# Patient Record
Sex: Female | Born: 1995 | Race: White | Hispanic: No | Marital: Married
Health system: Southern US, Community
[De-identification: ages and names within clinical notes are randomized; demographics above are authoritative.]

## PROBLEM LIST (undated history)

## (undated) DIAGNOSIS — F419 Anxiety disorder, unspecified: Secondary | ICD-10-CM

## (undated) DIAGNOSIS — E785 Hyperlipidemia, unspecified: Secondary | ICD-10-CM

## (undated) HISTORY — DX: Anxiety disorder, unspecified: F41.9

## (undated) HISTORY — DX: Hyperlipidemia, unspecified: E78.5

---

## 2016-04-07 DIAGNOSIS — Z0000 Encounter for general adult medical examination without abnormal findings: Secondary | ICD-10-CM | POA: Diagnosis not present

## 2016-04-07 DIAGNOSIS — Z1159 Encounter for screening for other viral diseases: Secondary | ICD-10-CM | POA: Diagnosis not present

## 2016-04-25 DIAGNOSIS — Z23 Encounter for immunization: Secondary | ICD-10-CM | POA: Diagnosis not present

## 2016-05-11 DIAGNOSIS — Z6822 Body mass index (BMI) 22.0-22.9, adult: Secondary | ICD-10-CM | POA: Diagnosis not present

## 2016-05-11 DIAGNOSIS — I781 Nevus, non-neoplastic: Secondary | ICD-10-CM | POA: Diagnosis not present

## 2016-05-29 DIAGNOSIS — Z23 Encounter for immunization: Secondary | ICD-10-CM | POA: Diagnosis not present

## 2016-09-29 DIAGNOSIS — Z0184 Encounter for antibody response examination: Secondary | ICD-10-CM | POA: Diagnosis not present

## 2016-10-31 DIAGNOSIS — Z23 Encounter for immunization: Secondary | ICD-10-CM | POA: Diagnosis not present

## 2017-02-25 DIAGNOSIS — Z6821 Body mass index (BMI) 21.0-21.9, adult: Secondary | ICD-10-CM | POA: Diagnosis not present

## 2018-04-13 DIAGNOSIS — Z6824 Body mass index (BMI) 24.0-24.9, adult: Secondary | ICD-10-CM | POA: Diagnosis not present

## 2018-04-13 DIAGNOSIS — N926 Irregular menstruation, unspecified: Secondary | ICD-10-CM | POA: Diagnosis not present

## 2019-07-25 ENCOUNTER — Encounter: Payer: Self-pay | Admitting: Women's Health

## 2019-07-25 ENCOUNTER — Encounter: Payer: 59 | Admitting: Women's Health

## 2019-07-25 ENCOUNTER — Other Ambulatory Visit: Payer: Self-pay

## 2019-07-25 ENCOUNTER — Other Ambulatory Visit (HOSPITAL_COMMUNITY)
Admission: RE | Admit: 2019-07-25 | Discharge: 2019-07-25 | Disposition: A | Discharge status: Home / Self Care | Payer: 59 | Source: Ambulatory Visit | Attending: Obstetrics & Gynecology | Admitting: Obstetrics & Gynecology

## 2019-07-25 ENCOUNTER — Ambulatory Visit: Payer: 59 | Admitting: Women's Health

## 2019-07-25 VITALS — BP 135/82 | HR 112 | Ht 66.5 in | Wt 172.0 lb

## 2019-07-25 DIAGNOSIS — N6310 Unspecified lump in the right breast, unspecified quadrant: Secondary | ICD-10-CM

## 2019-07-25 DIAGNOSIS — Z01419 Encounter for gynecological examination (general) (routine) without abnormal findings: Secondary | ICD-10-CM

## 2019-07-25 NOTE — Progress Notes (Signed)
   WELL-WOMAN EXAMINATION Patient name: Emmalie Haigh MRN [id number]  Date of birth: [birth year] Chief Complaint:   No chief complaint on file.  History of Present Illness:   Becky Garcia is a 24 y.o. G0P0000 Caucasian female being seen today for a routine well-woman exam.  Current complaints: none  Depression screen PHQ 2/9 07/25/2019  Decreased Interest 0  Down, Depressed, Hopeless 0  PHQ - 2 Score 0  Altered sleeping 0  Tired, decreased energy 0  Change in appetite 0  Feeling bad or failure about yourself  0  Trouble concentrating 0  Moving slowly or fidgety/restless 0  Suicidal thoughts 0  PHQ-9 Score 0  Difficult doing work/chores Not difficult at all     PCP: [facility]      does not desire labs Patient's last menstrual period was 07/04/2019 (exact date). The current method of family planning is OCP (estrogen/progesterone).  Last pap never. Results were: N/A. H/O abnormal pap: no Last mammogram: never. Results were: N/A. Family h/o breast cancer: yes possibly [place] Last colonoscopy: never. Results were: N/A. Family h/o colorectal cancer: yes PGGM Review of Systems:   Pertinent items are noted in HPI Denies any headaches, blurred vision, fatigue, shortness of breath, chest pain, abdominal pain, abnormal vaginal discharge/itching/odor/irritation, problems with periods, bowel movements, urination, or intercourse unless otherwise stated above. Pertinent History Reviewed:  Reviewed past medical,surgical, social and family history.  Reviewed problem list, medications and allergies. Physical Assessment:   Vitals:   07/25/19 1511  BP: 135/82  Pulse: (!) 112  Weight: 172 lb (78 kg)  Height: 5' 6.5" (1.689 m)  Body mass index is 27.35 kg/m.        Physical Examination:   General appearance - well appearing, and in no distress  Mental status - alert, oriented to person, place, and time  Psych:  She has a normal mood and affect  Skin - warm and dry, normal color, no  suspicious lesions noted  Chest - effort normal, all lung fields clear to auscultation bilaterally  Heart - normal rate and regular rhythm  Neck:  midline trachea, no thyromegaly or nodules  Breasts - Lt breast appear normal, no suspicious masses, no skin or nipple changes or axillary nodes, Rt breast ~0.5cm firm round mass 1 o'clock w/in areola  Abdomen - soft, nontender, nondistended, no masses or organomegaly  Pelvic - VULVA: normal appearing vulva with no masses, tenderness or lesions  VAGINA: normal appearing vagina with normal color and discharge, no lesions  CERVIX: normal appearing cervix without discharge or lesions, no CMT  Thin prep pap is done w/ HR HPV cotesting  UTERUS: uterus is felt to be normal size, shape, consistency and nontender   ADNEXA: No adnexal masses or tenderness noted.  Extremities:  No swelling or varicosities noted  Chaperone: Peggy Dones    No results found for this or any previous visit (from the past 24 hour(s)).  Assessment & Plan:  1) Well-Woman Exam  2) Rt breast mass> breast u/s 7/6 @ 1140 @ [facility], be there @ 1120  Labs/procedures today: pap  Mammogram @25yo  or sooner if problems Colonoscopy @25yo  or sooner if problems  Orders Placed This Encounter  Procedures  . US BREAST LTD UNI RIGHT INC AXILLA    Meds: No orders of the defined types were placed in this encounter.   Follow-up: No follow-ups on file.  [place], [facility] 07/25/2019 3:13 PM

## 2019-07-25 NOTE — Patient Instructions (Signed)
Breast ultrasound 7/6 @ 11:40am at [facility], be there at 11:20am, no lotion/deoderant/powder/perfume that day

## 2019-07-26 NOTE — Progress Notes (Signed)
This encounter was created in error - please disregard. Visit type was changed from gyn visit to pap & physical.

## 2019-07-28 LAB — CYTOLOGY - PAP
Chlamydia: NEGATIVE
Comment: NEGATIVE
Comment: NEGATIVE
Comment: NORMAL
Diagnosis: NEGATIVE
High risk HPV: NEGATIVE
Neisseria Gonorrhea: NEGATIVE

## 2019-08-08 ENCOUNTER — Ambulatory Visit (HOSPITAL_COMMUNITY)
Admission: RE | Admit: 2019-08-08 | Discharge: 2019-08-08 | Disposition: A | Discharge status: Home / Self Care | Payer: 59 | Source: Ambulatory Visit | Attending: Women's Health | Admitting: Women's Health

## 2019-08-08 ENCOUNTER — Other Ambulatory Visit: Payer: Self-pay

## 2019-08-08 DIAGNOSIS — N6310 Unspecified lump in the right breast, unspecified quadrant: Secondary | ICD-10-CM | POA: Diagnosis present

## 2020-07-27 IMAGING — US US BREAST*R* LIMITED INC AXILLA
1 series · 4 of 4 positions shown · non-contrast
Comparison: None.

CLINICAL DATA: 24-year-old female with a palpable area of concern
in the right breast which she has been feeling for the past 2 years.
She states this has not changed in size.

EXAM:
ULTRASOUND OF THE RIGHT BREAST

[Series 1: us breast*right* limited inc axilla · 0.04mm/px · 4 of 4 slices shown]
[im 1/4]
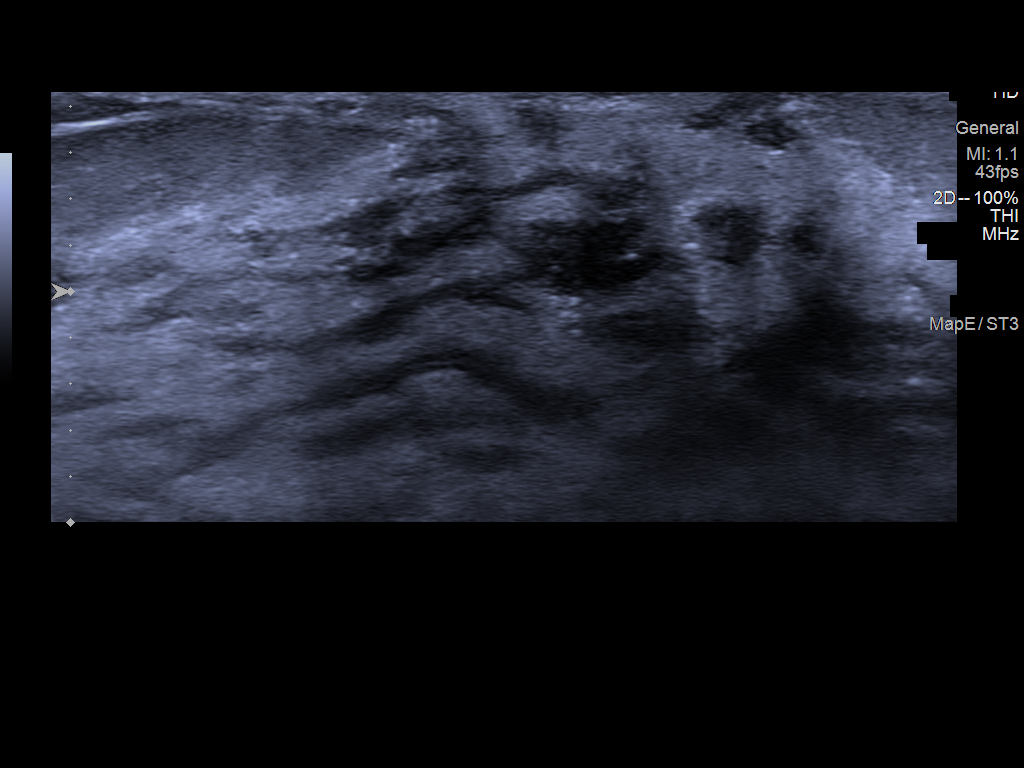
[im 2/4]
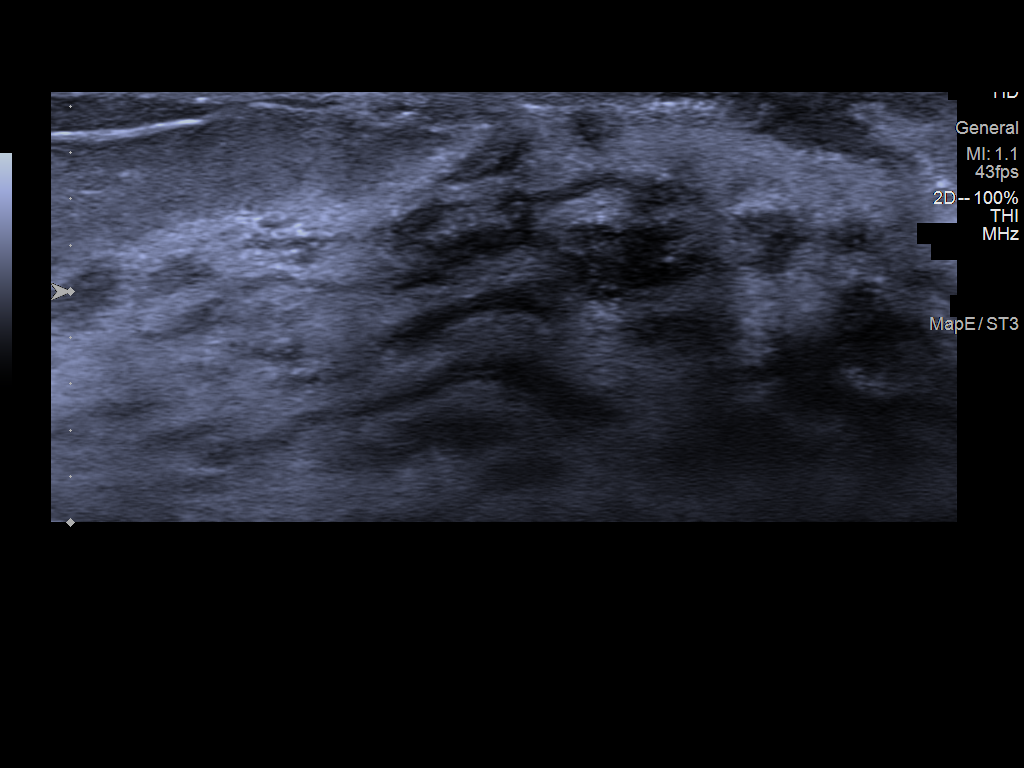
[im 3/4]
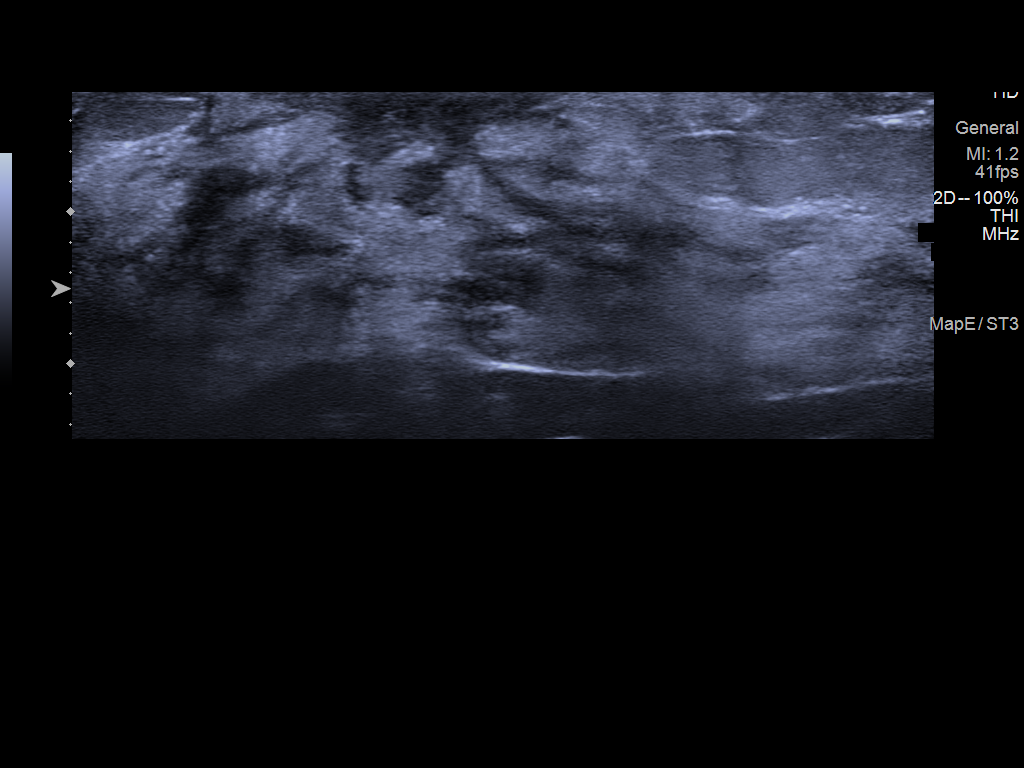
[im 4/4]
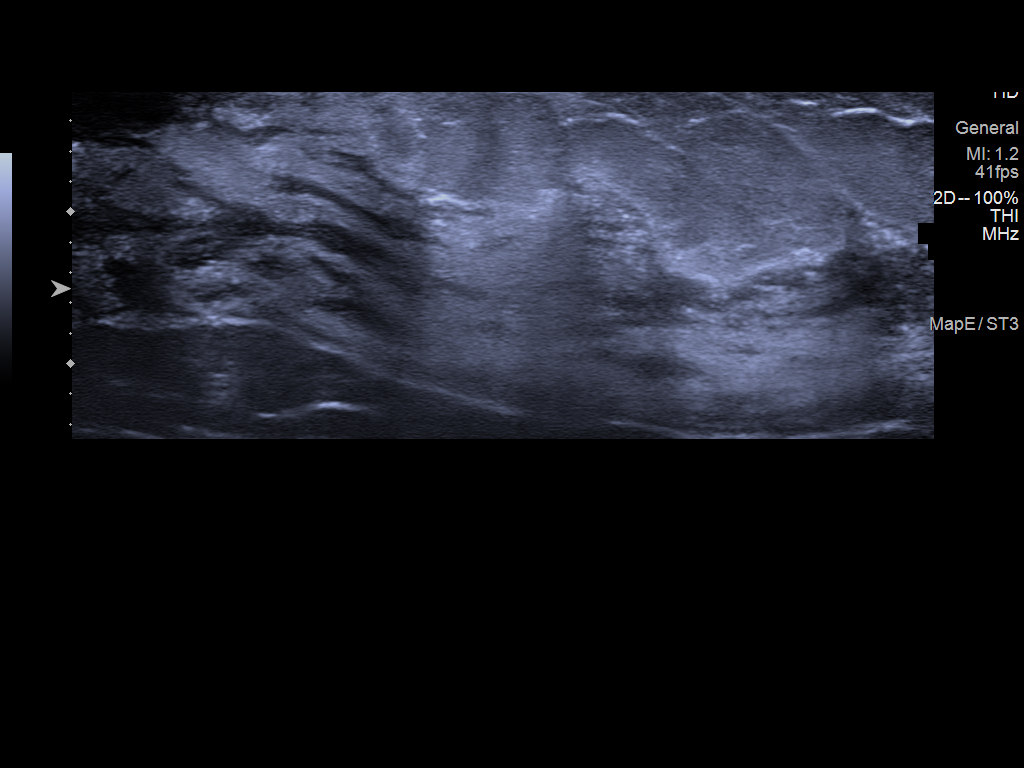

[4 of 4 positions shown; findings below may reference images not displayed]

FINDINGS: Physical examination reveals a pea-sized mobile area of thickening
at the 12 o'clock periareolar location.

Targeted ultrasound of the right breast was performed. No suspicious
masses or abnormality seen, only dense fibroglandular tissue
identified. This palpable area of concern may be related to dense
fibroglandular tissue in this location.
IMPRESSION: No suspicious sonographic abnormalities at the site of palpable
concern in the right breast. This palpable area of concern may be
related to dense fibroglandular tissue in this location.

RECOMMENDATION:
1. Recommend further management of the right breast palpable area of
concern be based on clinical assessment.

2. Screening mammogram at age 40 unless there are persistent or
intervening clinical concerns. (Code:IW-[id number])

I have discussed the findings and recommendations with the patient.
If applicable, a reminder letter will be sent to the patient
regarding the next appointment.

BI-RADS CATEGORY  1: Negative.

## 2021-06-17 ENCOUNTER — Ambulatory Visit: Payer: BC Managed Care – PPO | Admitting: Women's Health

## 2021-07-22 ENCOUNTER — Encounter: Payer: Self-pay | Admitting: Women's Health

## 2021-07-22 ENCOUNTER — Ambulatory Visit (INDEPENDENT_AMBULATORY_CARE_PROVIDER_SITE_OTHER): Payer: BC Managed Care – PPO | Admitting: Women's Health

## 2021-07-22 VITALS — BP 134/76 | HR 100

## 2021-07-22 DIAGNOSIS — F419 Anxiety disorder, unspecified: Secondary | ICD-10-CM | POA: Insufficient documentation

## 2021-07-22 DIAGNOSIS — Z3169 Encounter for other general counseling and advice on procreation: Secondary | ICD-10-CM

## 2021-07-22 MED ORDER — ESCITALOPRAM OXALATE 10 MG PO TABS: 10.0000 mg | ORAL_TABLET | Freq: Every day | ORAL | 3 refills | Status: DC

## 2021-07-22 NOTE — Progress Notes (Signed)
Patient wants to get pregnant within the next few months and wants to know if Prozac is ok to take.   LMP 07/01/21

## 2021-07-22 NOTE — Progress Notes (Signed)
GYN VISIT Patient name: Becky Garcia MRN [id number]  Date of birth: 03-28-1995 Chief Complaint:   Gynecologic Exam  History of Present Illness:   Becky Garcia is a 26 y.o. G46 Caucasian female being seen today to discuss getting pregnant. Is going to [place] in Sept- doesn't want to be pregnant or on period when she goes. Wants to start trying when they get back. Already taking pnv. Is on prozac, wanted to know if ok to continue. Takes for anxiety, no depression. Has never tried anything else. Her mom is on lexapro and does well w/ it.   No LMP recorded. (Menstrual status: Oral contraceptives). The current method of family planning is OCP (estrogen/progesterone).  Last pap 07/25/19. Results were: NILM w/ HRHPV negative     07/25/2019    2:30 PM  Depression screen PHQ 2/9  Decreased Interest 0  Down, Depressed, Hopeless 0  PHQ - 2 Score 0  Altered sleeping 0  Tired, decreased energy 0  Change in appetite 0  Feeling bad or failure about yourself  0  Trouble concentrating 0  Moving slowly or fidgety/restless 0  Suicidal thoughts 0  PHQ-9 Score 0  Difficult doing work/chores Not difficult at all        07/25/2019    2:30 PM  GAD 7 : Generalized Anxiety Score  Nervous, Anxious, on Edge 0  Control/stop worrying 0  Worry too much - different things 0  Trouble relaxing 0  Restless 0  Easily annoyed or irritable 0  Afraid - awful might happen 0  Total GAD 7 Score 0  Anxiety Difficulty Not difficult at all     Review of Systems:   Pertinent items are noted in HPI Denies fever/chills, dizziness, headaches, visual disturbances, fatigue, shortness of breath, chest pain, abdominal pain, vomiting, abnormal vaginal discharge/itching/odor/irritation, problems with periods, bowel movements, urination, or intercourse unless otherwise stated above.  Pertinent History Reviewed:  Reviewed past medical,surgical, social, obstetrical and family history.  Reviewed problem list, medications and  allergies. Physical Assessment:   Vitals:   07/22/21 0934  BP: 134/76  Pulse: 100  There is no height or weight on file to calculate BMI.       Physical Examination:   General appearance: alert, well appearing, and in no distress  Mental status: alert, oriented to person, place, and time  Skin: warm & dry   Cardiovascular: normal heart rate noted  Respiratory: normal respiratory effort, no distress  Abdomen: soft, non-tender   Pelvic: examination not indicated  Extremities: no edema   Chaperone: N/A    No results found for this or any previous visit (from the past 24 hour(s)).  Assessment & Plan:  1) Preconception counseling> can take COCs continuously to skip a period while in [place]. Stop COCs when gets back. Continue pnv.   2) Anxiety> Discussed prozac has been associated w/ fetal cardiac malformations, so best to switch before pregnancy. Pt ok w/ trialing lexapro '10mg'$  daily. F/U 4wks (can be online).   Meds:  Meds ordered this encounter  Medications   escitalopram (LEXAPRO) 10 MG tablet    Sig: Take 1 tablet (10 mg total) by mouth daily.    Dispense:  90 tablet    Refill:  3    Order Specific Question:   Supervising Provider    Answer:   Elonda Husky, LUTHER H [[id number]]    No orders of the defined types were placed in this encounter.   Return in about 4 weeks (around 08/19/2021) for med  f/u, MyChart Video.  [place], [facility] 07/22/2021 10:06 AM

## 2021-08-19 ENCOUNTER — Telehealth: Payer: BC Managed Care – PPO | Admitting: Women's Health

## 2021-08-25 ENCOUNTER — Encounter: Payer: Self-pay | Admitting: Women's Health

## 2021-08-25 ENCOUNTER — Telehealth (INDEPENDENT_AMBULATORY_CARE_PROVIDER_SITE_OTHER): Payer: BC Managed Care – PPO | Admitting: Women's Health

## 2021-08-25 DIAGNOSIS — F419 Anxiety disorder, unspecified: Secondary | ICD-10-CM

## 2021-08-25 NOTE — Progress Notes (Signed)
TELEHEALTH VIRTUAL GYN VISIT ENCOUNTER NOTE Patient name: Becky Garcia MRN [id number]  Date of birth: 1995-08-03  I connected with patient on 08/25/21 at  9:30 AM EDT by MyChart video  and verified that I am speaking with the correct person using two identifiers.  Pt is not currently in the office, she is at work.  Provider is in the office.    I discussed the limitations, risks, security and privacy concerns of performing an evaluation and management service by telephone and the availability of in person appointments. I also discussed with the patient that there may be a patient responsible charge related to this service. The patient expressed understanding and agreed to proceed.   Chief Complaint:   Medication f/u  History of Present Illness:   Becky Garcia is a 26 y.o. G33 Caucasian female being evaluated today for f/u on lexapro '10mg'$  rx'd 07/22/21. She had previously been on prozac and is planning pregnancy soon, so stopped prozac d/t potential fetal cardiac malformations. States she is doing well on lexapro, feels the same as when she was on prozac, feels '10mg'$  is an appropriate dose. PHQ9 1 & GAD7 1. Goes to [place] in Sept, and plans to stop COCs when she returns to try to conceive. Is already taking pnv.     08/25/2021    9:57 AM 07/25/2019    2:30 PM  Depression screen PHQ 2/9  Decreased Interest 0 0  Down, Depressed, Hopeless 0 0  PHQ - 2 Score 0 0  Altered sleeping 0 0  Tired, decreased energy 1 0  Change in appetite 0 0  Feeling bad or failure about yourself  0 0  Trouble concentrating 0 0  Moving slowly or fidgety/restless 0 0  Suicidal thoughts 0 0  PHQ-9 Score 1 0  Difficult doing work/chores  Not difficult at all    Patient's last menstrual period was 08/25/2021. The current method of family planning is OCP (estrogen/progesterone).  Last pap 07/25/19. Results were:  NILM w/ HRHPV negative Review of Systems:   Pertinent items are noted in HPI Denies fever/chills,  dizziness, headaches, visual disturbances, fatigue, shortness of breath, chest pain, abdominal pain, vomiting, abnormal vaginal discharge/itching/odor/irritation, problems with periods, bowel movements, urination, or intercourse unless otherwise stated above.  Pertinent History Reviewed:  Reviewed past medical,surgical, social, obstetrical and family history.  Reviewed problem list, medications and allergies. Physical Assessment:  There were no vitals filed for this visit.There is no height or weight on file to calculate BMI.       Physical Examination:   General:  Alert, oriented and cooperative.   Mental Status: Normal mood and affect perceived. Normal judgment and thought content.  Physical exam deferred due to nature of the encounter  No results found for this or any previous visit (from the past 24 hour(s)).  Assessment & Plan:  1) Anxiety> doing well on lexapro '10mg'$  after switching from prozac (d/t trying to conceive soon, and potential risk for fetal cardiac malformation).   2) Plans pregnancy soon> plans to stop COCs when returns from [place]. Continue pnv. Let us know when gets +HPT.    Meds: No orders of the defined types were placed in this encounter.   No orders of the defined types were placed in this encounter.   I discussed the assessment and treatment plan with the patient. The patient was provided an opportunity to ask questions and all were answered. The patient agreed with the plan and demonstrated an understanding of the instructions.  The patient was advised to call back or seek an in-person evaluation/go to the ED if the symptoms worsen or if the condition fails to improve as anticipated.  I provided 5 minutes of non-face-to-face time during this encounter.   Return for prn.  [place], [facility] 08/25/2021 10:11 AM

## 2021-08-29 ENCOUNTER — Encounter: Payer: Self-pay | Admitting: Women's Health

## 2022-01-12 ENCOUNTER — Encounter: Payer: Self-pay | Admitting: Women's Health

## 2022-01-15 ENCOUNTER — Encounter: Payer: Self-pay | Admitting: Women's Health

## 2022-01-20 ENCOUNTER — Other Ambulatory Visit: Payer: Self-pay | Admitting: Obstetrics & Gynecology

## 2022-01-20 DIAGNOSIS — O3680X0 Pregnancy with inconclusive fetal viability, not applicable or unspecified: Secondary | ICD-10-CM

## 2022-01-21 ENCOUNTER — Encounter: Payer: Self-pay | Admitting: *Deleted

## 2022-01-21 ENCOUNTER — Ambulatory Visit (INDEPENDENT_AMBULATORY_CARE_PROVIDER_SITE_OTHER): Payer: BC Managed Care – PPO | Admitting: *Deleted

## 2022-01-21 ENCOUNTER — Ambulatory Visit (INDEPENDENT_AMBULATORY_CARE_PROVIDER_SITE_OTHER): Payer: BC Managed Care – PPO

## 2022-01-21 VITALS — BP 125/77 | HR 83 | Wt 176.0 lb

## 2022-01-21 DIAGNOSIS — Z3401 Encounter for supervision of normal first pregnancy, first trimester: Secondary | ICD-10-CM

## 2022-01-21 DIAGNOSIS — O3680X0 Pregnancy with inconclusive fetal viability, not applicable or unspecified: Secondary | ICD-10-CM

## 2022-01-21 DIAGNOSIS — Z3A01 Less than 8 weeks gestation of pregnancy: Secondary | ICD-10-CM | POA: Diagnosis not present

## 2022-01-21 DIAGNOSIS — Z3400 Encounter for supervision of normal first pregnancy, unspecified trimester: Secondary | ICD-10-CM | POA: Insufficient documentation

## 2022-01-21 NOTE — Progress Notes (Signed)
   INITIAL OB NURSE INTAKE  SUBJECTIVE:  Becky Garcia is a 26 y.o. No obstetric history on file. female Unknown by LMP c/w today's U/S with an Estimated Date of Delivery: None noted. being seen today for her initial OB intake/educational visit with RN. She is taking prenatal vitamins.  She is not having nausea and/or vomiting and does not request nausea meds at this time.  Patient's last menstrual period was 11/27/2021.  Patient's medical, surgical, and obstetrical history obtained and reviewed.  Current medications reviewed.   Patient Active Problem List   Diagnosis Date Noted   Supervision of normal first pregnancy 01/21/2022   Anxiety 07/22/2021   Past Medical History:  Diagnosis Date   Anxiety    Hyperlipemia    History reviewed. No pertinent surgical history. OB History     Gravida  1   Para      Term      Preterm      AB      Living         SAB      IAB      Ectopic      Multiple      Live Births              OBJECTIVE:  BP 125/77   Pulse 83   Wt 176 lb (79.8 kg)   LMP 11/27/2021   BMI 27.98 kg/m   ASSESSMENT/PLAN: No obstetric history on file. at Unknown with an Estimated Date of Delivery: None noted.  Prenatal vitamins: continue   Nausea medicines: not currently needed   OB packet given: Yes BabyScripts and MyChart activated  Blood Pressure Cuff: has at home. Discussed to be used for virtual visits and home BP checks.  Genetic & carrier screening discussed: requests NT/IT, undecided about Panorama and Horizon  Placed OB Box on problem list and updated Reviewed recommended weight gain based on pre-gravid BMI BMI 25-29.9, gain max 15-25lb  Follow-up in 5 weeks for NT U/S & New OB visit with provider  Face-to-face time at least 30 minutes. 50% or more of this visit was spent in counseling and coordination of care.  Kristeen Miss Sanna Porcaro RN-C 01/21/2022 4:34 PM

## 2022-01-21 NOTE — Progress Notes (Signed)
[place] 7+6 wks,single IUP with YS,FHR 150 bpm,normal right ovary,simple left corpus luteal cyst 2.5 x 2.3 x 2.2 cm,CRL 11.97 mm

## 2022-01-28 ENCOUNTER — Encounter: Payer: Self-pay | Admitting: Women's Health

## 2022-02-02 NOTE — L&D Delivery Note (Signed)
OB/GYN Faculty Practice Delivery Note  Becky Garcia is a 27 y.o. G1P1001 s/p VD at [redacted]w[redacted]d. She was admitted for SOL.   ROM: 0h 24m with  fluid GBS Status:  Negative/-- (07/11 1215) Maximum Maternal Temperature: 98.69F  Labor Progress: Initial SVE: 7/90/-2. She then progressed to complete.   Delivery Date/Time: 08/28/22 1152 Delivery: Called to room and patient was complete and pushing. Head delivered LOA. Nuchal cord present x1, loose and reduced. Shoulder and body delivered in usual fashion. Infant with spontaneous cry, placed on mother's abdomen, dried and stimulated. Cord clamped x 2 after 1-minute delay, and cut by FOB. Cord blood drawn. Placenta delivered spontaneously with gentle cord traction. Fundus firm with massage and Pitocin. Labia, perineum, vagina, and cervix inspected with 2nd degree laceration, repaired in usual fashion.  Baby Weight: pending  Placenta: 3 vessel, intact. Sent to L&D Complications: None Lacerations: as above EBL: 127 mL Analgesia: Epidural   Infant:  APGAR (1 MIN): 9  APGAR (5 MINS): 9   Myrtie Hawk, DO OB Family Medicine Fellow, [facility] for [employer], [facility] 08/28/2022, 12:59 PM

## 2022-02-27 ENCOUNTER — Other Ambulatory Visit: Payer: Self-pay | Admitting: Obstetrics & Gynecology

## 2022-02-27 DIAGNOSIS — Z3682 Encounter for antenatal screening for nuchal translucency: Secondary | ICD-10-CM

## 2022-03-02 ENCOUNTER — Ambulatory Visit (INDEPENDENT_AMBULATORY_CARE_PROVIDER_SITE_OTHER): Payer: BC Managed Care – PPO

## 2022-03-02 ENCOUNTER — Encounter: Payer: Self-pay | Admitting: Women's Health

## 2022-03-02 ENCOUNTER — Ambulatory Visit (INDEPENDENT_AMBULATORY_CARE_PROVIDER_SITE_OTHER): Payer: BC Managed Care – PPO | Admitting: Women's Health

## 2022-03-02 VITALS — BP 114/73 | HR 88 | Wt 171.0 lb

## 2022-03-02 DIAGNOSIS — D219 Benign neoplasm of connective and other soft tissue, unspecified: Secondary | ICD-10-CM

## 2022-03-02 DIAGNOSIS — Z3A13 13 weeks gestation of pregnancy: Secondary | ICD-10-CM | POA: Diagnosis not present

## 2022-03-02 DIAGNOSIS — Z3682 Encounter for antenatal screening for nuchal translucency: Secondary | ICD-10-CM | POA: Diagnosis not present

## 2022-03-02 DIAGNOSIS — Z3401 Encounter for supervision of normal first pregnancy, first trimester: Secondary | ICD-10-CM | POA: Diagnosis not present

## 2022-03-02 DIAGNOSIS — Z131 Encounter for screening for diabetes mellitus: Secondary | ICD-10-CM

## 2022-03-02 NOTE — Patient Instructions (Signed)
Becky Garcia, thank you for choosing our office today! We appreciate the opportunity to meet your healthcare needs. You may receive a short survey by mail, e-mail, or through [employer]. If you are happy with your care we would appreciate if you could take just a few minutes to complete the survey questions. We read all of your comments and take your feedback very seriously. Thank you again for choosing our office.  [facility] at [place] at [facility] ([place], [place] [address]) Entrance C, located off of [address] parking   Nausea & Vomiting Have saltine crackers or pretzels by your bed and eat a few bites before you raise your head out of bed in the morning Eat small frequent meals throughout the day instead of large meals Drink plenty of fluids throughout the day to stay hydrated, just don't drink a lot of fluids with your meals.  This can make your stomach fill up faster making you feel sick Do not brush your teeth right after you eat Products with real ginger are good for nausea, like ginger ale and ginger hard candy Make sure it says made with real ginger! Sucking on sour candy like lemon heads is also good for nausea If your prenatal vitamins make you nauseated, take them at night so you will sleep through the nausea Sea Bands If you feel like you need medicine for the nausea & vomiting please let us know If you are unable to keep any fluids or food down please let us know   Constipation Drink plenty of fluid, preferably water, throughout the day Eat foods high in fiber such as fruits, vegetables, and grains Exercise, such as walking, is a good way to keep your bowels regular Drink warm fluids, especially warm prune juice, or decaf coffee Eat a 1/2 cup of real oatmeal (not instant), 1/2 cup applesauce, and 1/2-1 cup warm prune juice every day If needed, you may take Colace (docusate sodium) stool softener  once or twice a day to help keep the stool soft.  If you still are having problems with constipation, you may take Miralax once daily as needed to help keep your bowels regular.   Home Blood Pressure Monitoring for Patients   Your provider has recommended that you check your blood pressure (BP) at least once a week at home. If you do not have a blood pressure cuff at home, one will be provided for you. Contact your provider if you have not received your monitor within 1 week.   Helpful Tips for Accurate Home Blood Pressure Checks  Don't smoke, exercise, or drink caffeine 30 minutes before checking your BP Use the restroom before checking your BP (a full bladder can raise your pressure) Relax in a comfortable upright chair Feet on the ground Left arm resting comfortably on a flat surface at the level of your heart Legs uncrossed Back supported Sit quietly and don't talk Place the cuff on your bare arm Adjust snuggly, so that only two fingertips can fit between your skin and the top of the cuff Check 2 readings separated by at least one minute Keep a log of your BP readings For a visual, please reference this diagram: http://ccnc.care/bpdiagram  Provider Name: [facility]     Phone: [phone]  Zone 1: ALL CLEAR  Continue to monitor your symptoms:  BP reading is less than 140 (top number) or less than 90 (bottom  number)  No right upper stomach pain No headaches or seeing spots No feeling nauseated or throwing up No swelling in face and hands  Zone 2: CAUTION Call your doctor's office for any of the following:  BP reading is greater than 140 (top number) or greater than 90 (bottom number)  Stomach pain under your ribs in the middle or right side Headaches or seeing spots Feeling nauseated or throwing up Swelling in face and hands  Zone 3: EMERGENCY  Seek immediate medical care if you have any of the following:  BP reading is greater than160 (top number) or greater than  110 (bottom number) Severe headaches not improving with Tylenol Serious difficulty catching your breath Any worsening symptoms from Zone 2    First Trimester of Pregnancy The first trimester of pregnancy is from week 1 until the end of week 12 (months 1 through 3). A week after a sperm fertilizes an egg, the egg will implant on the wall of the uterus. This embryo will begin to develop into a baby. Genes from you and your partner are forming the baby. The female genes determine whether the baby is a boy or a girl. At 6-8 weeks, the eyes and face are formed, and the heartbeat can be seen on ultrasound. At the end of 12 weeks, all the baby's organs are formed.  Now that you are pregnant, you will want to do everything you can to have a healthy baby. Two of the most important things are to get good prenatal care and to follow your health care provider's instructions. Prenatal care is all the medical care you receive before the baby's birth. This care will help prevent, find, and treat any problems during the pregnancy and childbirth. BODY CHANGES Your body goes through many changes during pregnancy. The changes vary from woman to woman.  You may gain or lose a couple of pounds at first. You may feel sick to your stomach (nauseous) and throw up (vomit). If the vomiting is uncontrollable, call your health care provider. You may tire easily. You may develop headaches that can be relieved by medicines approved by your health care provider. You may urinate more often. Painful urination may mean you have a bladder infection. You may develop heartburn as a result of your pregnancy. You may develop constipation because certain hormones are causing the muscles that push waste through your intestines to slow down. You may develop hemorrhoids or swollen, bulging veins (varicose veins). Your breasts may begin to grow larger and become tender. Your nipples may stick out more, and the tissue that surrounds them  (areola) may become darker. Your gums may bleed and may be sensitive to brushing and flossing. Dark spots or blotches (chloasma, mask of pregnancy) may develop on your face. This will likely fade after the baby is born. Your menstrual periods will stop. You may have a loss of appetite. You may develop cravings for certain kinds of food. You may have changes in your emotions from day to day, such as being excited to be pregnant or being concerned that something may go wrong with the pregnancy and baby. You may have more vivid and strange dreams. You may have changes in your hair. These can include thickening of your hair, rapid growth, and changes in texture. Some women also have hair loss during or after pregnancy, or hair that feels dry or thin. Your hair will most likely return to normal after your baby is born. WHAT TO EXPECT AT YOUR PRENATAL  VISITS During a routine prenatal visit: You will be weighed to make sure you and the baby are growing normally. Your blood pressure will be taken. Your abdomen will be measured to track your baby's growth. The fetal heartbeat will be listened to starting around week 10 or 12 of your pregnancy. Test results from any previous visits will be discussed. Your health care provider may ask you: How you are feeling. If you are feeling the baby move. If you have had any abnormal symptoms, such as leaking fluid, bleeding, severe headaches, or abdominal cramping. If you have any questions. Other tests that may be performed during your first trimester include: Blood tests to find your blood type and to check for the presence of any previous infections. They will also be used to check for low iron levels (anemia) and Rh antibodies. Later in the pregnancy, blood tests for diabetes will be done along with other tests if problems develop. Urine tests to check for infections, diabetes, or protein in the urine. An ultrasound to confirm the proper growth and development  of the baby. An amniocentesis to check for possible genetic problems. Fetal screens for spina bifida and Down syndrome. You may need other tests to make sure you and the baby are doing well. HOME CARE INSTRUCTIONS  Medicines Follow your health care provider's instructions regarding medicine use. Specific medicines may be either safe or unsafe to take during pregnancy. Take your prenatal vitamins as directed. If you develop constipation, try taking a stool softener if your health care provider approves. Diet Eat regular, well-balanced meals. Choose a variety of foods, such as meat or vegetable-based protein, fish, milk and low-fat dairy products, vegetables, fruits, and whole grain breads and cereals. Your health care provider will help you determine the amount of weight gain that is right for you. Avoid raw meat and uncooked cheese. These carry germs that can cause birth defects in the baby. Eating four or five small meals rather than three large meals a day may help relieve nausea and vomiting. If you start to feel nauseous, eating a few soda crackers can be helpful. Drinking liquids between meals instead of during meals also seems to help nausea and vomiting. If you develop constipation, eat more high-fiber foods, such as fresh vegetables or fruit and whole grains. Drink enough fluids to keep your urine clear or pale yellow. Activity and Exercise Exercise only as directed by your health care provider. Exercising will help you: Control your weight. Stay in shape. Be prepared for labor and delivery. Experiencing pain or cramping in the lower abdomen or low back is a good sign that you should stop exercising. Check with your health care provider before continuing normal exercises. Try to avoid standing for long periods of time. Move your legs often if you must stand in one place for a long time. Avoid heavy lifting. Wear low-heeled shoes, and practice good posture. You may continue to have sex  unless your health care provider directs you otherwise. Relief of Pain or Discomfort Wear a good support bra for breast tenderness.   Take warm sitz baths to soothe any pain or discomfort caused by hemorrhoids. Use hemorrhoid cream if your health care provider approves.   Rest with your legs elevated if you have leg cramps or low back pain. If you develop varicose veins in your legs, wear support hose. Elevate your feet for 15 minutes, 3-4 times a day. Limit salt in your diet. Prenatal Care Schedule your prenatal visits by the  twelfth week of pregnancy. They are usually scheduled monthly at first, then more often in the last 2 months before delivery. Write down your questions. Take them to your prenatal visits. Keep all your prenatal visits as directed by your health care provider. Safety Wear your seat belt at all times when driving. Make a list of emergency phone numbers, including numbers for family, friends, the hospital, and police and fire departments. General Tips Ask your health care provider for a referral to a local prenatal education class. Begin classes no later than at the beginning of month 6 of your pregnancy. Ask for help if you have counseling or nutritional needs during pregnancy. Your health care provider can offer advice or refer you to specialists for help with various needs. Do not use hot tubs, steam rooms, or saunas. Do not douche or use tampons or scented sanitary pads. Do not cross your legs for long periods of time. Avoid cat litter boxes and soil used by cats. These carry germs that can cause birth defects in the baby and possibly loss of the fetus by miscarriage or stillbirth. Avoid all smoking, herbs, alcohol, and medicines not prescribed by your health care provider. Chemicals in these affect the formation and growth of the baby. Schedule a dentist appointment. At home, brush your teeth with a soft toothbrush and be gentle when you floss. SEEK MEDICAL CARE IF:   You have dizziness. You have mild pelvic cramps, pelvic pressure, or nagging pain in the abdominal area. You have persistent nausea, vomiting, or diarrhea. You have a bad smelling vaginal discharge. You have pain with urination. You notice increased swelling in your face, hands, legs, or ankles. SEEK IMMEDIATE MEDICAL CARE IF:  You have a fever. You are leaking fluid from your vagina. You have spotting or bleeding from your vagina. You have severe abdominal cramping or pain. You have rapid weight gain or loss. You vomit blood or material that looks like coffee grounds. You are exposed to [place] measles and have never had them. You are exposed to fifth disease or chickenpox. You develop a severe headache. You have shortness of breath. You have any kind of trauma, such as from a fall or a car accident. Document Released: 01/13/2001 Document Revised: 06/05/2013 Document Reviewed: 11/29/2012 [facility] Patient Information 2015 [place], [place]. This information is not intended to replace advice given to you by your health care provider. Make sure you discuss any questions you have with your health care provider.

## 2022-03-02 NOTE — Progress Notes (Signed)
INITIAL OBSTETRICAL VISIT Patient name: Becky Garcia MRN [id number]  Date of birth: [birth year] Chief Complaint:   Initial Prenatal Visit  History of Present Illness:   Becky Garcia is a 27 y.o. G4P0 Caucasian female at 25w4dby LMP c/w u/s at 7 weeks with an Estimated Date of Delivery: 09/03/22 being seen today for her initial obstetrical visit.   Patient's last menstrual period was 11/27/2021. Her obstetrical history is significant for primigravida.   Today she reports  n/v improving, is going to start weaning vit b 6 & unisom .  Last pap 07/25/19. Results were: NILM w/ HRHPV negative     03/02/2022    1:45 PM 08/25/2021    9:57 AM 07/25/2019    2:30 PM  Depression screen PHQ 2/9  Decreased Interest 0 0 0  Down, Depressed, Hopeless 0 0 0  PHQ - 2 Score 0 0 0  Altered sleeping 0 0 0  Tired, decreased energy 1 1 0  Change in appetite 0 0 0  Feeling bad or failure about yourself  0 0 0  Trouble concentrating 0 0 0  Moving slowly or fidgety/restless 0 0 0  Suicidal thoughts 0 0 0  PHQ-9 Score 1 1 0  Difficult doing work/chores   Not difficult at all        03/02/2022    1:45 PM 08/25/2021    9:59 AM 07/25/2019    2:30 PM  GAD 7 : Generalized Anxiety Score  Nervous, Anxious, on Edge 1 1 0  Control/stop worrying 0 0 0  Worry too much - different things 1 0 0  Trouble relaxing 0 0 0  Restless 0 0 0  Easily annoyed or irritable 0 0 0  Afraid - awful might happen 0 0 0  Total GAD 7 Score 2 1 0  Anxiety Difficulty   Not difficult at all     Review of Systems:   Pertinent items are noted in HPI Denies cramping/contractions, leakage of fluid, vaginal bleeding, abnormal vaginal discharge w/ itching/odor/irritation, headaches, visual changes, shortness of breath, chest pain, abdominal pain, severe nausea/vomiting, or problems with urination or bowel movements unless otherwise stated above.  Pertinent History Reviewed:  Reviewed past medical,surgical, social, obstetrical and family  history.  Reviewed problem list, medications and allergies. OB History  Gravida Para Term Preterm AB Living  1            SAB IAB Ectopic Multiple Live Births               # Outcome Date GA Lbr Len/2nd Weight Sex Delivery Anes PTL Lv  1 Current            Physical Assessment:   Vitals:   03/02/22 1418 03/02/22 1420  BP: 131/79 114/73  Pulse: 94 88  Weight: 171 lb (77.6 kg)   Body mass index is 27.19 kg/m.       Physical Examination:  General appearance - well appearing, and in no distress  Mental status - alert, oriented to person, place, and time  Psych:  She has a normal mood and affect  Skin - warm and dry, normal color, no suspicious lesions noted  Chest - effort normal, all lung fields clear to auscultation bilaterally  Heart - normal rate and regular rhythm  Abdomen - soft, nontender  Extremities:  No swelling or varicosities noted  Thin prep pap is not done   Chaperone: N/A    TODAY'S NT [place]13+4 wks,measurements c/w dates,FHR  153 bpm,anterior placenta,normal ovaries,anterior subserosal fibroid 2.1 x 2 x 1.3 cm,normal ovaries,NB present, NT 1.8 mm   No results found for this or any previous visit (from the past 24 hour(s)).  Assessment & Plan:  1) Low-Risk Pregnancy G1P0 at 60w4dwith an Estimated Date of Delivery: 09/03/22   2) Initial OB visit  3) Anterior fibroid> small, discussed  4) Anxiety> doing well on lexapro '10mg'$   Meds: No orders of the defined types were placed in this encounter.   Initial labs obtained Continue prenatal vitamins Reviewed n/v relief measures and warning s/s to report Reviewed recommended weight gain based on pre-gravid BMI Encouraged well-balanced diet Genetic & carrier screening discussed: requests NT/IT, declines Panorama and Horizon  Ultrasound discussed; fetal survey: requested [place] form faxed if has or is planning to apply for medicaid The nature of [employer]for [facility]with multiple MDs  and other Advanced Practice Providers was explained to patient; also emphasized that fellows, residents, and students are part of our team. Does have home bp cuff. Office bp cuff given: no. Rx sent: n/a. Check bp weekly, let uKoreaknow if consistently >140/90.   Indications for ASA therapy (per uptodate) None  Indications for early A1C (per uptodate) BMI >=25 (>=23 in Asian women) AND one of the following Strong family hx  Follow-up: Return in about 3 weeks (around 03/23/2022) for LROB, 2nd IT, CNM, in person; then 6wks from now for anatomy u/s and LROB w/ CNM.   Orders Placed This Encounter  Procedures   Urine Culture   GC/Chlamydia Probe Amp   CBC/D/Plt+RPR+Rh+ABO+RubIgG...   Integrated 1   Hgb Fractionation Cascade   Hemoglobin A1c    KRoma SchanzCNM, WWagner [facility] Hospital1/29/2024 3:04 PM

## 2022-03-02 NOTE — Progress Notes (Signed)
[place] 13+4 wks,measurements c/w dates,FHR 153 bpm,anterior placenta,normal ovaries,anterior subserosal fibroid 2.1 x 2  x 1.3 cm,normal ovaries,NB present, NT 1.8 mm

## 2022-03-03 LAB — INTEGRATED 1

## 2022-03-04 LAB — URINE CULTURE

## 2022-03-04 LAB — GC/CHLAMYDIA PROBE AMP
Chlamydia trachomatis, NAA: NEGATIVE
Neisseria Gonorrhoeae by PCR: NEGATIVE

## 2022-03-04 LAB — INTEGRATED 1

## 2022-03-06 LAB — CBC/D/PLT+RPR+RH+ABO+RUBIGG...
Antibody Screen: NEGATIVE
Basophils Absolute: 0 10*3/uL (ref 0.0–0.2)
Basos: 0 %
EOS (ABSOLUTE): 0 10*3/uL (ref 0.0–0.4)
Eos: 0 %
HCV Ab: NONREACTIVE
HIV Screen 4th Generation wRfx: NONREACTIVE
Hematocrit: 35.6 % (ref 34.0–46.6)
Hemoglobin: 12.2 g/dL (ref 11.1–15.9)
Hepatitis B Surface Ag: NEGATIVE
Immature Grans (Abs): 0.1 10*3/uL (ref 0.0–0.1)
Immature Granulocytes: 1 %
Lymphocytes Absolute: 1.6 10*3/uL (ref 0.7–3.1)
Lymphs: 16 %
MCH: 29.1 pg (ref 26.6–33.0)
MCHC: 34.3 g/dL (ref 31.5–35.7)
MCV: 85 fL (ref 79–97)
Monocytes Absolute: 0.6 10*3/uL (ref 0.1–0.9)
Monocytes: 6 %
Neutrophils Absolute: 7.7 10*3/uL — ABNORMAL HIGH (ref 1.4–7.0)
Neutrophils: 77 %
Platelets: 173 10*3/uL (ref 150–450)
RBC: 4.19 x10E6/uL (ref 3.77–5.28)
RDW: 14 % (ref 11.7–15.4)
RPR Ser Ql: NONREACTIVE
Rh Factor: POSITIVE
Rubella Antibodies, IGG: 1.12 index (ref >0.99)
WBC: 10.1 10*3/uL (ref 3.4–10.8)

## 2022-03-06 LAB — HGB FRACTIONATION CASCADE
Hgb A2: 2.6 % (ref 1.8–3.2)
Hgb A: 97.4 % (ref 96.4–98.8)
Hgb F: 0 % (ref 0.0–2.0)
Hgb S: 0 %

## 2022-03-06 LAB — INTEGRATED 1
Crown Rump Length: 72.2 mm
Gest. Age on Collection Date: 13.1 weeks
Maternal Age at EDD: 28.2 yr
Nuchal Translucency (NT): 1.8 mm
Number of Fetuses: 1
PAPP-A Value: 1490.4 ng/mL

## 2022-03-06 LAB — HEMOGLOBIN A1C
Est. average glucose Bld gHb Est-mCnc: 100 mg/dL
Hgb A1c MFr Bld: 5.1 % (ref 4.8–5.6)

## 2022-03-06 LAB — HCV INTERPRETATION

## 2022-03-23 ENCOUNTER — Encounter: Payer: BC Managed Care – PPO | Admitting: Women's Health

## 2022-03-30 ENCOUNTER — Encounter: Payer: BC Managed Care – PPO | Admitting: Women's Health

## 2022-03-30 ENCOUNTER — Ambulatory Visit (INDEPENDENT_AMBULATORY_CARE_PROVIDER_SITE_OTHER): Payer: BC Managed Care – PPO | Admitting: Women's Health

## 2022-03-30 ENCOUNTER — Encounter: Payer: Self-pay | Admitting: Women's Health

## 2022-03-30 VITALS — BP 121/77 | HR 83 | Wt 170.0 lb

## 2022-03-30 DIAGNOSIS — O26892 Other specified pregnancy related conditions, second trimester: Secondary | ICD-10-CM

## 2022-03-30 DIAGNOSIS — Z1379 Encounter for other screening for genetic and chromosomal anomalies: Secondary | ICD-10-CM

## 2022-03-30 DIAGNOSIS — N898 Other specified noninflammatory disorders of vagina: Secondary | ICD-10-CM

## 2022-03-30 DIAGNOSIS — Z3402 Encounter for supervision of normal first pregnancy, second trimester: Secondary | ICD-10-CM

## 2022-03-30 DIAGNOSIS — Z3A17 17 weeks gestation of pregnancy: Secondary | ICD-10-CM

## 2022-03-30 DIAGNOSIS — Z363 Encounter for antenatal screening for malformations: Secondary | ICD-10-CM

## 2022-03-30 NOTE — Patient Instructions (Signed)
Peniel, thank you for choosing our office today! We appreciate the opportunity to meet your healthcare needs. You may receive a short survey by mail, e-mail, or through [employer]. If you are happy with your care we would appreciate if you could take just a few minutes to complete the survey questions. We read all of your comments and take your feedback very seriously. Thank you again for choosing our office.  [facility] [employer] Team at [place] at [facility] ([place], [place] [address]) Entrance C, located off of [address] parking  Go to [email or web address] to register for FREE online childbirth classes  Call the office [phone]) or go to [facility] if: You begin to severe cramping Your water breaks.  Sometimes it is a big gush of fluid, sometimes it is just a trickle that keeps getting your panties wet or running down your legs You have vaginal bleeding.  It is normal to have a small amount of spotting if your cervix was checked.   [facility] Pediatricians/Family Doctors [facility]): [address] [place], [facility]: [address] [place], [phone]                [facility]): [place], [phone] (call to ask if accepting patients) [facility]: [address], [place], [place] Pediatricians/Family Doctors [facility]): [place]. [place], [address], [facility]: [address] [place], [place] [facility]: [place], [facility]): [phone] [facility]: [address], [place]: [address] [facility]: [phone], [phone]  Home Blood Pressure Monitoring for Patients   Your provider has recommended that you check your blood pressure (BP) at least once a week at home. If you do not have a blood pressure cuff at home, one will be provided for you. Contact your provider if you have not received your monitor within 1 week.   Helpful Tips for Accurate Home Blood Pressure Checks  Don't smoke, exercise, or drink caffeine 30 minutes before checking your BP Use the restroom before checking your BP (a full bladder can raise your pressure) Relax in a comfortable upright chair Feet on the ground Left arm resting comfortably on a flat surface at the level of your heart Legs uncrossed Back supported Sit quietly and don't talk Place the cuff on your bare arm Adjust snuggly, so that only two fingertips can fit between your skin and the top of the cuff Check 2 readings separated by at least one minute Keep a log of your BP readings For a visual, please reference this diagram: http://ccnc.care/bpdiagram  Provider Name: [facility]     Phone: [phone]  Zone 1: ALL CLEAR  Continue to monitor your symptoms:  BP reading is less than 140 (top number) or less than 90 (bottom number)  No right upper stomach pain No headaches or seeing spots No feeling nauseated or throwing up No swelling in face and hands  Zone 2: CAUTION Call your doctor's office for any of the following:  BP reading is greater than 140 (top number) or greater than  90 (bottom number)  Stomach pain under your ribs in the middle or right side Headaches or seeing spots Feeling nauseated or throwing up Swelling in face and hands  Zone 3: EMERGENCY  Seek immediate medical care if you have any of the following:  BP reading is greater than160 (top number) or greater than 110 (bottom number) Severe headaches not improving with Tylenol Serious difficulty catching your breath Any worsening symptoms from  Zone 2     Second Trimester of Pregnancy The second trimester is from week 14 through week 27 (months 4 through 6). The second trimester is often a time when you feel your best. Your body has adjusted to being pregnant, and you begin to feel better physically. Usually, morning sickness has lessened or quit completely, you may have more energy, and you may have an increase in appetite. The second trimester is also a time when the fetus is growing rapidly. At the end of the sixth month, the fetus is about 9 inches long and weighs about 1 pounds. You will likely begin to feel the baby move (quickening) between 16 and 20 weeks of pregnancy. Body changes during your second trimester Your body continues to go through many changes during your second trimester. The changes vary from woman to woman. Your weight will continue to increase. You will notice your lower abdomen bulging out. You may begin to get stretch marks on your hips, abdomen, and breasts. You may develop headaches that can be relieved by medicines. The medicines should be approved by your health care provider. You may urinate more often because the fetus is pressing on your bladder. You may develop or continue to have heartburn as a result of your pregnancy. You may develop constipation because certain hormones are causing the muscles that push waste through your intestines to slow down. You may develop hemorrhoids or swollen, bulging veins (varicose veins). You may have back pain. This is caused by: Weight gain. Pregnancy hormones that are relaxing the joints in your pelvis. A shift in weight and the muscles that support your balance. Your breasts will continue to grow and they will continue to become tender. Your gums may bleed and may be sensitive to brushing and flossing. Dark spots or blotches (chloasma, mask of pregnancy) may develop on your face. This will likely fade after the baby is born. A dark line from your belly button to  the pubic area (linea nigra) may appear. This will likely fade after the baby is born. You may have changes in your hair. These can include thickening of your hair, rapid growth, and changes in texture. Some women also have hair loss during or after pregnancy, or hair that feels dry or thin. Your hair will most likely return to normal after your baby is born.  What to expect at prenatal visits During a routine prenatal visit: You will be weighed to make sure you and the fetus are growing normally. Your blood pressure will be taken. Your abdomen will be measured to track your baby's growth. The fetal heartbeat will be listened to. Any test results from the previous visit will be discussed.  Your health care provider may ask you: How you are feeling. If you are feeling the baby move. If you have had any abnormal symptoms, such as leaking fluid, bleeding, severe headaches, or abdominal cramping. If you are using any tobacco products, including cigarettes, chewing tobacco, and electronic cigarettes. If you have any questions.  Other tests that may be performed during   your second trimester include: Blood tests that check for: Low iron levels (anemia). High blood sugar that affects pregnant women (gestational diabetes) between 41 and 28 weeks. Rh antibodies. This is to check for a protein on red blood cells (Rh factor). Urine tests to check for infections, diabetes, or protein in the urine. An ultrasound to confirm the proper growth and development of the baby. An amniocentesis to check for possible genetic problems. Fetal screens for spina bifida and Down syndrome. HIV (human immunodeficiency virus) testing. Routine prenatal testing includes screening for HIV, unless you choose not to have this test.  Follow these instructions at home: Medicines Follow your health care provider's instructions regarding medicine use. Specific medicines may be either safe or unsafe to take during  pregnancy. Take a prenatal vitamin that contains at least 600 micrograms (mcg) of folic acid. If you develop constipation, try taking a stool softener if your health care provider approves. Eating and drinking Eat a balanced diet that includes fresh fruits and vegetables, whole grains, good sources of protein such as meat, eggs, or tofu, and low-fat dairy. Your health care provider will help you determine the amount of weight gain that is right for you. Avoid raw meat and uncooked cheese. These carry germs that can cause birth defects in the baby. If you have low calcium intake from food, talk to your health care provider about whether you should take a daily calcium supplement. Limit foods that are high in fat and processed sugars, such as fried and sweet foods. To prevent constipation: Drink enough fluid to keep your urine clear or pale yellow. Eat foods that are high in fiber, such as fresh fruits and vegetables, whole grains, and beans. Activity Exercise only as directed by your health care provider. Most women can continue their usual exercise routine during pregnancy. Try to exercise for 30 minutes at least 5 days a week. Stop exercising if you experience uterine contractions. Avoid heavy lifting, wear low heel shoes, and practice good posture. A sexual relationship may be continued unless your health care provider directs you otherwise. Relieving pain and discomfort Wear a good support bra to prevent discomfort from breast tenderness. Take warm sitz baths to soothe any pain or discomfort caused by hemorrhoids. Use hemorrhoid cream if your health care provider approves. Rest with your legs elevated if you have leg cramps or low back pain. If you develop varicose veins, wear support hose. Elevate your feet for 15 minutes, 3-4 times a day. Limit salt in your diet. Prenatal Care Write down your questions. Take them to your prenatal visits. Keep all your prenatal visits as told by your health  care provider. This is important. Safety Wear your seat belt at all times when driving. Make a list of emergency phone numbers, including numbers for family, friends, the hospital, and police and fire departments. General instructions Ask your health care provider for a referral to a local prenatal education class. Begin classes no later than the beginning of month 6 of your pregnancy. Ask for help if you have counseling or nutritional needs during pregnancy. Your health care provider can offer advice or refer you to specialists for help with various needs. Do not use hot tubs, steam rooms, or saunas. Do not douche or use tampons or scented sanitary pads. Do not cross your legs for long periods of time. Avoid cat litter boxes and soil used by cats. These carry germs that can cause birth defects in the baby and possibly loss of the  fetus by miscarriage or stillbirth. Avoid all smoking, herbs, alcohol, and unprescribed drugs. Chemicals in these products can affect the formation and growth of the baby. Do not use any products that contain nicotine or tobacco, such as cigarettes and e-cigarettes. If you need help quitting, ask your health care provider. Visit your dentist if you have not gone yet during your pregnancy. Use a soft toothbrush to brush your teeth and be gentle when you floss. Contact a health care provider if: You have dizziness. You have mild pelvic cramps, pelvic pressure, or nagging pain in the abdominal area. You have persistent nausea, vomiting, or diarrhea. You have a bad smelling vaginal discharge. You have pain when you urinate. Get help right away if: You have a fever. You are leaking fluid from your vagina. You have spotting or bleeding from your vagina. You have severe abdominal cramping or pain. You have rapid weight gain or weight loss. You have shortness of breath with chest pain. You notice sudden or extreme swelling of your face, hands, ankles, feet, or legs. You  have not felt your baby move in over an hour. You have severe headaches that do not go away when you take medicine. You have vision changes. Summary The second trimester is from week 14 through week 27 (months 4 through 6). It is also a time when the fetus is growing rapidly. Your body goes through many changes during pregnancy. The changes vary from woman to woman. Avoid all smoking, herbs, alcohol, and unprescribed drugs. These chemicals affect the formation and growth your baby. Do not use any tobacco products, such as cigarettes, chewing tobacco, and e-cigarettes. If you need help quitting, ask your health care provider. Contact your health care provider if you have any questions. Keep all prenatal visits as told by your health care provider. This is important. This information is not intended to replace advice given to you by your health care provider. Make sure you discuss any questions you have with your health care provider. Document Released: 01/13/2001 Document Revised: 06/27/2015 Document Reviewed: 03/22/2012 Elsevier Interactive Patient Education  2017 [employer].

## 2022-03-30 NOTE — Progress Notes (Signed)
LOW-RISK PREGNANCY VISIT Patient name: Becky Garcia MRN [id number]  Date of birth: 23-Jul-1995 Chief Complaint:   Routine Prenatal Visit  History of Present Illness:   Becky Garcia is a 27 y.o. G1P0 female at 81w4dwith an Estimated Date of Delivery: 09/03/22 being seen today for ongoing management of a low-risk pregnancy.   Today she reports  went to br earlier today and voided, when sat back down at desk, felt like had some discharge, looked and had some wetness on pants. Didn't happen again . Has had increase in vaginal discharge, but denies abnormal discharge, itching/odor/irritation.   Pulled muscle in back few weeks ago, still burns at times, declines need for muscle relaxer. Contractions: Not present. Vag. Bleeding: None.  Movement: Absent. denies leaking of fluid.     03/02/2022    1:45 PM 08/25/2021    9:57 AM 07/25/2019    2:30 PM  Depression screen PHQ 2/9  Decreased Interest 0 0 0  Down, Depressed, Hopeless 0 0 0  PHQ - 2 Score 0 0 0  Altered sleeping 0 0 0  Tired, decreased energy 1 1 0  Change in appetite 0 0 0  Feeling bad or failure about yourself  0 0 0  Trouble concentrating 0 0 0  Moving slowly or fidgety/restless 0 0 0  Suicidal thoughts 0 0 0  PHQ-9 Score 1 1 0  Difficult doing work/chores   Not difficult at all        03/02/2022    1:45 PM 08/25/2021    9:59 AM 07/25/2019    2:30 PM  GAD 7 : Generalized Anxiety Score  Nervous, Anxious, on Edge 1 1 0  Control/stop worrying 0 0 0  Worry too much - different things 1 0 0  Trouble relaxing 0 0 0  Restless 0 0 0  Easily annoyed or irritable 0 0 0  Afraid - awful might happen 0 0 0  Total GAD 7 Score 2 1 0  Anxiety Difficulty   Not difficult at all      Review of Systems:   Pertinent items are noted in HPI Denies abnormal vaginal discharge w/ itching/odor/irritation, headaches, visual changes, shortness of breath, chest pain, abdominal pain, severe nausea/vomiting, or problems with urination or bowel  movements unless otherwise stated above. Pertinent History Reviewed:  Reviewed past medical,surgical, social, obstetrical and family history.  Reviewed problem list, medications and allergies. Physical Assessment:   Vitals:   03/30/22 1548  BP: 121/77  Pulse: 83  Weight: 170 lb (77.1 kg)  Body mass index is 27.03 kg/m.        Physical Examination:   General appearance: Well appearing, and in no distress  Mental status: Alert, oriented to person, place, and time  Skin: Warm & dry  Cardiovascular: Normal heart rate noted  Respiratory: Normal respiratory effort, no distress  Abdomen: Soft, gravid, nontender  Pelvic: SSE: cx visually long/closed, no pooling, no change w/ valsalva. +yellow d/c, CV swab obtained        Extremities:    Fetal Status: Fetal Heart Rate (bpm): 142   Movement: Absent    Chaperone: N/A   No results found for this or any previous visit (from the past 24 hour(s)).  Assessment & Plan:  1) Low-risk pregnancy G1P0 at 152w4dith an Estimated Date of Delivery: 09/03/22   2) Vaginal discharge, no evidence of ROM, CV swab sent. Discussed s/s ROM, reasons to seek care  3) Pulled muscle in back> ok to  use heating pad, no longer than 47mns, nowhere near abd. Declines muscle relaxer   Meds: No orders of the defined types were placed in this encounter.  Labs/procedures today: spec exam, CV swab, and 2nd IT  Plan:  Continue routine obstetrical care  Next visit: prefers will be in person for u/s     Reviewed: Preterm labor symptoms and general obstetric precautions including but not limited to vaginal bleeding, contractions, leaking of fluid and fetal movement were reviewed in detail with the patient.  All questions were answered. Does have home bp cuff. Office bp cuff given: not applicable. Check bp weekly, let uKore[place]now if consistently >140 and/or >90.  Follow-up: Return for As scheduled.  Future Appointments  Date Time Provider [place] 04/13/2022 10:45  AM [facility] [place] None  04/13/2022 11:30 AM BRoma Schanz CNM [facility]    Orders Placed This Encounter  Procedures   [place]OB Comp + 14 Wk   INTEGRATED 2   [facility]04 PM

## 2022-03-31 ENCOUNTER — Other Ambulatory Visit (HOSPITAL_COMMUNITY)
Admission: RE | Admit: 2022-03-31 | Discharge: 2022-03-31 | Disposition: A | Discharge status: Home / Self Care | Payer: BC Managed Care – PPO | Source: Ambulatory Visit | Attending: Women's Health | Admitting: Women's Health

## 2022-03-31 DIAGNOSIS — O26892 Other specified pregnancy related conditions, second trimester: Secondary | ICD-10-CM | POA: Insufficient documentation

## 2022-03-31 DIAGNOSIS — N898 Other specified noninflammatory disorders of vagina: Secondary | ICD-10-CM | POA: Insufficient documentation

## 2022-03-31 DIAGNOSIS — Z3402 Encounter for supervision of normal first pregnancy, second trimester: Secondary | ICD-10-CM | POA: Insufficient documentation

## 2022-03-31 NOTE — Addendum Note (Signed)
Addended by: Octaviano Glow on: 03/31/2022 12:44 PM   Modules accepted: Orders

## 2022-04-01 LAB — INTEGRATED 2
AFP MoM: 1.88
Alpha-Fetoprotein: 69.8 ng/mL
Crown Rump Length: 72.2 mm
DIA MoM: 0.93
DIA Value: 140.5 pg/mL
Estriol, Unconjugated: 1.61 ng/mL
Gest. Age on Collection Date: 13.1 weeks
Gestational Age: 17.1 weeks
Maternal Age at EDD: 28.2 yr
Nuchal Translucency (NT): 1.8 mm
Nuchal Translucency MoM: 1.09
Number of Fetuses: 1
PAPP-A MoM: 1.31
PAPP-A Value: 1490.4 ng/mL
Test Results:: NEGATIVE
hCG MoM: 0.84
hCG Value: 25.2 IU/mL
uE3 MoM: 1.34

## 2022-04-01 LAB — CERVICOVAGINAL ANCILLARY ONLY
Bacterial Vaginitis (gardnerella): NEGATIVE
Candida Glabrata: NEGATIVE
Candida Vaginitis: NEGATIVE
Chlamydia: NEGATIVE
Comment: NEGATIVE
Comment: NEGATIVE
Comment: NEGATIVE
Comment: NEGATIVE
Comment: NEGATIVE
Comment: NORMAL
Neisseria Gonorrhea: NEGATIVE
Trichomonas: NEGATIVE

## 2022-04-13 ENCOUNTER — Encounter: Payer: Self-pay | Admitting: Women's Health

## 2022-04-13 ENCOUNTER — Ambulatory Visit (INDEPENDENT_AMBULATORY_CARE_PROVIDER_SITE_OTHER): Payer: BC Managed Care – PPO | Admitting: Women's Health

## 2022-04-13 ENCOUNTER — Ambulatory Visit (INDEPENDENT_AMBULATORY_CARE_PROVIDER_SITE_OTHER): Payer: BC Managed Care – PPO

## 2022-04-13 VITALS — BP 120/69 | HR 84 | Wt 167.0 lb

## 2022-04-13 DIAGNOSIS — Z363 Encounter for antenatal screening for malformations: Secondary | ICD-10-CM

## 2022-04-13 DIAGNOSIS — Z3402 Encounter for supervision of normal first pregnancy, second trimester: Secondary | ICD-10-CM | POA: Diagnosis not present

## 2022-04-13 DIAGNOSIS — Z3A19 19 weeks gestation of pregnancy: Secondary | ICD-10-CM

## 2022-04-13 NOTE — Progress Notes (Signed)
[place] 19+4 wks,breech,anterior placenta gr 0,normal ovaries,FHR 142 bpm,CX 2.8 cm,small anterior fibroid N/C 2 cm,SVP of fluid 4.6 cm,EFW 290 g 35%,anatomy complete,no obvious abnormalities

## 2022-04-13 NOTE — Progress Notes (Signed)
LOW-RISK PREGNANCY VISIT Patient name: Becky Garcia MRN [id number]  Date of birth: [birth year] Chief Complaint:   Routine Prenatal Visit (Anatomy scan/)  History of Present Illness:   Becky Garcia is a 27 y.o. G1P0 female at 43w4dwith an Estimated Date of Delivery: 09/03/22 being seen today for ongoing management of a low-risk pregnancy.   Today she reports no complaints. Contractions: Not present.  .  Movement: Present. denies leaking of fluid.     03/02/2022    1:45 PM 08/25/2021    9:57 AM 07/25/2019    2:30 PM  Depression screen PHQ 2/9  Decreased Interest 0 0 0  Down, Depressed, Hopeless 0 0 0  PHQ - 2 Score 0 0 0  Altered sleeping 0 0 0  Tired, decreased energy 1 1 0  Change in appetite 0 0 0  Feeling bad or failure about yourself  0 0 0  Trouble concentrating 0 0 0  Moving slowly or fidgety/restless 0 0 0  Suicidal thoughts 0 0 0  PHQ-9 Score 1 1 0  Difficult doing work/chores   Not difficult at all        03/02/2022    1:45 PM 08/25/2021    9:59 AM 07/25/2019    2:30 PM  GAD 7 : Generalized Anxiety Score  Nervous, Anxious, on Edge 1 1 0  Control/stop worrying 0 0 0  Worry too much - different things 1 0 0  Trouble relaxing 0 0 0  Restless 0 0 0  Easily annoyed or irritable 0 0 0  Afraid - awful might happen 0 0 0  Total GAD 7 Score 2 1 0  Anxiety Difficulty   Not difficult at all      Review of Systems:   Pertinent items are noted in HPI Denies abnormal vaginal discharge w/ itching/odor/irritation, headaches, visual changes, shortness of breath, chest pain, abdominal pain, severe nausea/vomiting, or problems with urination or bowel movements unless otherwise stated above. Pertinent History Reviewed:  Reviewed past medical,surgical, social, obstetrical and family history.  Reviewed problem list, medications and allergies. Physical Assessment:   Vitals:   04/13/22 1153  BP: 120/69  Pulse: 84  There is no height or weight on file to calculate BMI.         Physical Examination:   General appearance: Well appearing, and in no distress  Mental status: Alert, oriented to person, place, and time  Skin: Warm & dry  Cardiovascular: Normal heart rate noted  Respiratory: Normal respiratory effort, no distress  Abdomen: Soft, gravid, nontender  Pelvic: Cervical exam deferred         Extremities: Edema: None  Fetal Status:     Movement: Present  [place]19+4 wks,breech,anterior placenta gr 0,normal ovaries,FHR 142 bpm,CX 2.8 cm,small anterior fibroid N/C 2 cm,SVP of fluid 4.6 cm,EFW 290 g 35%,anatomy complete,no obvious abnormalities   Chaperone: N/A   No results found for this or any previous visit (from the past 24 hour(s)).  Assessment & Plan:  1) Low-risk pregnancy G1P0 at 173w4dith an Estimated Date of Delivery: 09/03/22    Meds: No orders of the defined types were placed in this encounter.  Labs/procedures today: U/S  Plan:  Continue routine obstetrical care  Next visit: prefers in person    Reviewed: Preterm labor symptoms and general obstetric precautions including but not limited to vaginal bleeding, contractions, leaking of fluid and fetal movement were reviewed in detail with the patient.  All questions were answered. Does have home  bp cuff. Office bp cuff given: not applicable. Check bp weekly, let us know if consistently >140 and/or >90.  Follow-up: Return in about 4 weeks (around 05/11/2022) for [place], [place], in person.  No future appointments.  No orders of the defined types were placed in this encounter.  [place], [facility] 04/13/2022 12:16 PM

## 2022-04-13 NOTE — Patient Instructions (Signed)
Becky Garcia, thank you for choosing our office today! We appreciate the opportunity to meet your healthcare needs. You may receive a short survey by mail, e-mail, or through MyChart. If you are happy with your care we would appreciate if you could take just a few minutes to complete the survey questions. We read all of your comments and take your feedback very seriously. Thank you again for choosing our office.  [facility] Team at [facility] [place] ([address] [place], [place] [address]) Entrance C, located off of [address] Free 24/7 valet parking  Go to [email or web address] to register for FREE online childbirth classes  Call the office ([phone]) or go to [facility] if: You begin to severe cramping Your water breaks.  Sometimes it is a big gush of fluid, sometimes it is just a trickle that keeps getting your panties wet or running down your legs You have vaginal bleeding.  It is normal to have a small amount of spotting if your cervix was checked.   [place] [facility] Doctors [facility] ([facility]): [address], [phone]           [facility]: [address], [phone]                [facility] ([facility]): [address], [phone] (call to ask if accepting patients) [facility]: [address], [place], [phone]    [place] [facility]/Family Doctors [facility] ([facility]): [address], [phone] [facility]: [address], [phone] [facility]: [address], [phone]  [place] [facility]  [facility] ([facility]): [phone] [facility]: [address], [phone]   [place] [facility]: [address], [phone]  [place] [facility]  [facility]: [address], [phone]  Home Blood Pressure Monitoring for Patients   Your provider has recommended that you check your blood pressure (BP) at least once a week at home. If you do not have a blood pressure cuff at home, one will be provided for you. Contact your provider if you have not received your monitor within 1 week.   Helpful Tips for Accurate Home Blood Pressure Checks  Don't smoke, exercise, or drink caffeine 30 minutes before checking your BP Use the restroom before checking your BP (a full bladder can raise your pressure) Relax in a comfortable upright chair Feet on the ground Left arm resting comfortably on a flat surface at the level of your heart Legs uncrossed Back supported Sit quietly and don't talk Place the cuff on your bare arm Adjust snuggly, so that only two fingertips can fit between your skin and the top of the cuff Check 2 readings separated by at least one minute Keep a log of your BP readings For a visual, please reference this diagram: http://[facility].care/bpdiagram  Provider Name: [facility]     Phone: [phone]  Zone 1: ALL CLEAR  Continue to monitor your symptoms:  BP reading is less than 140 (top number) or less than 90 (bottom number)  No right upper stomach pain No headaches or seeing spots No feeling nauseated or throwing up No swelling in face and hands  Zone 2: CAUTION Call your doctor's office for any of the following:  BP reading is greater than 140 (top number) or greater than   90 (bottom number)  Stomach pain under your ribs in the middle or right side Headaches or seeing spots Feeling nauseated or throwing up Swelling in face and hands  Zone 3: EMERGENCY  Seek immediate medical care if you have any of the following:  BP reading is greater than160 (top number) or greater than 110 (bottom number) Severe headaches not improving with Tylenol Serious difficulty catching your breath Any worsening symptoms from  Zone 2     Second Trimester of Pregnancy The second trimester is from week 14 through week 27 (months 4 through 6). The second trimester is often a time when you feel your best. Your body has adjusted to being pregnant, and you begin to feel better physically. Usually, morning sickness has lessened or quit completely, you may have more energy, and you may have an increase in appetite. The second trimester is also a time when the fetus is growing rapidly. At the end of the sixth month, the fetus is about 9 inches long and weighs about 1 pounds. You will likely begin to feel the baby move (quickening) between 16 and 20 weeks of pregnancy. Body changes during your second trimester Your body continues to go through many changes during your second trimester. The changes vary from woman to woman. Your weight will continue to increase. You will notice your lower abdomen bulging out. You may begin to get stretch marks on your hips, abdomen, and breasts. You may develop headaches that can be relieved by medicines. The medicines should be approved by your health care provider. You may urinate more often because the fetus is pressing on your bladder. You may develop or continue to have heartburn as a result of your pregnancy. You may develop constipation because certain hormones are causing the muscles that push waste through your intestines to slow down. You may develop hemorrhoids or swollen, bulging veins (varicose veins). You may have back pain. This is caused by: Weight gain. Pregnancy hormones that are relaxing the joints in your pelvis. A shift in weight and the muscles that support your balance. Your breasts will continue to grow and they will continue to become tender. Your gums may bleed and may be sensitive to brushing and flossing. Dark spots or blotches (chloasma, mask of pregnancy) may develop on your face. This will likely fade after the baby is born. A dark line from your belly button to  the pubic area (linea nigra) may appear. This will likely fade after the baby is born. You may have changes in your hair. These can include thickening of your hair, rapid growth, and changes in texture. Some women also have hair loss during or after pregnancy, or hair that feels dry or thin. Your hair will most likely return to normal after your baby is born.  What to expect at prenatal visits During a routine prenatal visit: You will be weighed to make sure you and the fetus are growing normally. Your blood pressure will be taken. Your abdomen will be measured to track your baby's growth. The fetal heartbeat will be listened to. Any test results from the previous visit will be discussed.  Your health care provider may ask you: How you are feeling. If you are feeling the baby move. If you have had any abnormal symptoms, such as leaking fluid, bleeding, severe headaches, or abdominal cramping. If you are using any tobacco products, including cigarettes, chewing tobacco, and electronic cigarettes. If you have any questions.  Other tests that may be performed during   your second trimester include: Blood tests that check for: Low iron levels (anemia). High blood sugar that affects pregnant women (gestational diabetes) between 24 and 28 weeks. Rh antibodies. This is to check for a protein on red blood cells (Rh factor). Urine tests to check for infections, diabetes, or protein in the urine. An ultrasound to confirm the proper growth and development of the baby. An amniocentesis to check for possible genetic problems. Fetal screens for spina bifida and Down syndrome. HIV (human immunodeficiency virus) testing. Routine prenatal testing includes screening for HIV, unless you choose not to have this test.  Follow these instructions at home: Medicines Follow your health care provider's instructions regarding medicine use. Specific medicines may be either safe or unsafe to take during  pregnancy. Take a prenatal vitamin that contains at least 600 micrograms (mcg) of folic acid. If you develop constipation, try taking a stool softener if your health care provider approves. Eating and drinking Eat a balanced diet that includes fresh fruits and vegetables, whole grains, good sources of protein such as meat, eggs, or tofu, and low-fat dairy. Your health care provider will help you determine the amount of weight gain that is right for you. Avoid raw meat and uncooked cheese. These carry germs that can cause birth defects in the baby. If you have low calcium intake from food, talk to your health care provider about whether you should take a daily calcium supplement. Limit foods that are high in fat and processed sugars, such as fried and sweet foods. To prevent constipation: Drink enough fluid to keep your urine clear or pale yellow. Eat foods that are high in fiber, such as fresh fruits and vegetables, whole grains, and beans. Activity Exercise only as directed by your health care provider. Most women can continue their usual exercise routine during pregnancy. Try to exercise for 30 minutes at least 5 days a week. Stop exercising if you experience uterine contractions. Avoid heavy lifting, wear low heel shoes, and practice good posture. A sexual relationship may be continued unless your health care provider directs you otherwise. Relieving pain and discomfort Wear a good support bra to prevent discomfort from breast tenderness. Take warm sitz baths to soothe any pain or discomfort caused by hemorrhoids. Use hemorrhoid cream if your health care provider approves. Rest with your legs elevated if you have leg cramps or low back pain. If you develop varicose veins, wear support hose. Elevate your feet for 15 minutes, 3-4 times a day. Limit salt in your diet. Prenatal Care Write down your questions. Take them to your prenatal visits. Keep all your prenatal visits as told by your health  care provider. This is important. Safety Wear your seat belt at all times when driving. Make a list of emergency phone numbers, including numbers for family, friends, the hospital, and police and fire departments. General instructions Ask your health care provider for a referral to a local prenatal education class. Begin classes no later than the beginning of month 6 of your pregnancy. Ask for help if you have counseling or nutritional needs during pregnancy. Your health care provider can offer advice or refer you to specialists for help with various needs. Do not use hot tubs, steam rooms, or saunas. Do not douche or use tampons or scented sanitary pads. Do not cross your legs for long periods of time. Avoid cat litter boxes and soil used by cats. These carry germs that can cause birth defects in the baby and possibly loss of the   fetus by miscarriage or stillbirth. Avoid all smoking, herbs, alcohol, and unprescribed drugs. Chemicals in these products can affect the formation and growth of the baby. Do not use any products that contain nicotine or tobacco, such as cigarettes and e-cigarettes. If you need help quitting, ask your health care provider. Visit your dentist if you have not gone yet during your pregnancy. Use a soft toothbrush to brush your teeth and be gentle when you floss. Contact a health care provider if: You have dizziness. You have mild pelvic cramps, pelvic pressure, or nagging pain in the abdominal area. You have persistent nausea, vomiting, or diarrhea. You have a bad smelling vaginal discharge. You have pain when you urinate. Get help right away if: You have a fever. You are leaking fluid from your vagina. You have spotting or bleeding from your vagina. You have severe abdominal cramping or pain. You have rapid weight gain or weight loss. You have shortness of breath with chest pain. You notice sudden or extreme swelling of your face, hands, ankles, feet, or legs. You  have not felt your baby move in over an hour. You have severe headaches that do not go away when you take medicine. You have vision changes. Summary The second trimester is from week 14 through week 27 (months 4 through 6). It is also a time when the fetus is growing rapidly. Your body goes through many changes during pregnancy. The changes vary from woman to woman. Avoid all smoking, herbs, alcohol, and unprescribed drugs. These chemicals affect the formation and growth your baby. Do not use any tobacco products, such as cigarettes, chewing tobacco, and e-cigarettes. If you need help quitting, ask your health care provider. Contact your health care provider if you have any questions. Keep all prenatal visits as told by your health care provider. This is important. This information is not intended to replace advice given to you by your health care provider. Make sure you discuss any questions you have with your health care provider. Document Released: 01/13/2001 Document Revised: 06/27/2015 Document Reviewed: 03/22/2012 [employer] Interactive Patient Education  2017 [employer]  

## 2022-04-18 ENCOUNTER — Encounter: Payer: Self-pay | Admitting: Women's Health

## 2022-05-11 ENCOUNTER — Ambulatory Visit (INDEPENDENT_AMBULATORY_CARE_PROVIDER_SITE_OTHER): Payer: BC Managed Care – PPO | Admitting: Women's Health

## 2022-05-11 ENCOUNTER — Encounter: Payer: Self-pay | Admitting: Women's Health

## 2022-05-11 VITALS — BP 115/69 | HR 78 | Wt 173.0 lb

## 2022-05-11 DIAGNOSIS — Z3402 Encounter for supervision of normal first pregnancy, second trimester: Secondary | ICD-10-CM

## 2022-05-11 DIAGNOSIS — Z3A23 23 weeks gestation of pregnancy: Secondary | ICD-10-CM

## 2022-05-11 NOTE — Patient Instructions (Signed)
Anavey, thank you for choosing our office today! We appreciate the opportunity to meet your healthcare needs. You may receive a short survey by mail, e-mail, or through [employer]. If you are happy with your care we would appreciate if you could take just a few minutes to complete the survey questions. We read all of your comments and take your feedback very seriously. Thank you again for choosing our office.  Center for [employer] Team at [facility]  [facility] at [facility] ([address] [place], [place] [address]) Entrance C, located off of [address] Free 24/7 valet parking   You will have your sugar test next visit.  Please do not eat or drink anything after midnight the night before you come, not even water.  You will be here for at least two hours.  Please make an appointment online for the bloodwork at [email or web address] for 8:00am (or as close to this as possible). Make sure you select the [facility] service center.   CLASSES: Go to [email or web address] to register for classes (childbirth, breastfeeding, waterbirth, infant CPR, daddy bootcamp, etc.)  Call the office [phone]) or go to [facility] if: You begin to have strong, frequent contractions Your water breaks.  Sometimes it is a big gush of fluid, sometimes it is just a trickle that keeps getting your panties wet or running down your legs You have vaginal bleeding.  It is normal to have a small amount of spotting if your cervix was checked.  You don't feel your baby moving like normal.  If you don't, get you something to eat and drink and lay down and focus on feeling your baby move.   If your baby is still not moving like normal, you should call the office or go to [facility].  Call the office [phone]) or go to [facility] hospital for these signs of pre-eclampsia: Severe headache that does not go away with Tylenol Visual changes- seeing spots, double, blurred vision Pain under your right breast or upper  abdomen that does not go away with Tums or heartburn medicine Nausea and/or vomiting Severe swelling in your hands, feet, and face    [place] Pediatricians/Family Doctors [facility]): [address] Colette Ribas, [phone]           [facility]: [address], [phone]                [facility]): [address], [phone]  [facility] Department: [address], [place], [phone]    [facility] Pediatricians/Family Doctors [facility]): [address], [phone] [facility]: [address] [place], [phone] [facility]: [address], [phone]  [facility] Doctors  [facility]): [phone] [facility]: [address] [phone]   [facility] Doctors [facility]: [address], [phone]  [facility] Doctors  [facility]: [phone], [phone]  Home Blood Pressure Monitoring for Patients   Your provider has recommended that you check your blood pressure (BP) at least once a week at home. If you do not have a blood pressure cuff at home, one will be provided for you. Contact your provider if you have not received your monitor within 1 week.   Helpful Tips for Accurate Home Blood Pressure Checks  Don't smoke, exercise, or drink caffeine 30 minutes before checking  your BP Use the restroom before checking your BP (a full bladder can raise your pressure) Relax in a comfortable upright chair Feet on the ground Left arm resting comfortably on a flat surface at the level of your heart Legs uncrossed Back supported Sit quietly and don't talk Place the cuff on your bare arm Adjust snuggly, so that only two fingertips can fit between your skin and the top of the cuff Check 2 readings separated by at least one  minute Keep a log of your BP readings For a visual, please reference this diagram: http://ccnc.care/bpdiagram  Provider Name: [facility]     Phone: [phone]  Zone 1: ALL CLEAR  Continue to monitor your symptoms:  BP reading is less than 140 (top number) or less than 90 (bottom number)  No right upper stomach pain No headaches or seeing spots No feeling nauseated or throwing up No swelling in face and hands  Zone 2: CAUTION Call your doctor's office for any of the following:  BP reading is greater than 140 (top number) or greater than 90 (bottom number)  Stomach pain under your ribs in the middle or right side Headaches or seeing spots Feeling nauseated or throwing up Swelling in face and hands  Zone 3: EMERGENCY  Seek immediate medical care if you have any of the following:  BP reading is greater than160 (top number) or greater than 110 (bottom number) Severe headaches not improving with Tylenol Serious difficulty catching your breath Any worsening symptoms from Zone 2   Second Trimester of Pregnancy The second trimester is from week 13 through week 28, months 4 through 6. The second trimester is often a time when you feel your best. Your body has also adjusted to being pregnant, and you begin to feel better physically. Usually, morning sickness has lessened or quit completely, you may have more energy, and you may have an increase in appetite. The second trimester is also a time when the fetus is growing rapidly. At the end of the sixth month, the fetus is about 9 inches long and weighs about 1 pounds. You will likely begin to feel the baby move (quickening) between 18 and 20 weeks of the pregnancy. BODY CHANGES Your body goes through many changes during pregnancy. The changes vary from woman to woman.  Your weight will continue to increase. You will notice your lower abdomen bulging out. You may begin to get stretch marks on your hips, abdomen, and breasts. You may  develop headaches that can be relieved by medicines approved by your health care provider. You may urinate more often because the fetus is pressing on your bladder. You may develop or continue to have heartburn as a result of your pregnancy. You may develop constipation because certain hormones are causing the muscles that push waste through your intestines to slow down. You may develop hemorrhoids or swollen, bulging veins (varicose veins). You may have back pain because of the weight gain and pregnancy hormones relaxing your joints between the bones in your pelvis and as a result of a shift in weight and the muscles that support your balance. Your breasts will continue to grow and be tender. Your gums may bleed and may be sensitive to brushing and flossing. Dark spots or blotches (chloasma, mask of pregnancy) may develop on your face. This will likely fade after the baby is born. A dark line from your belly button to the pubic area (linea nigra) may appear. This will likely fade after the  baby is born. You may have changes in your hair. These can include thickening of your hair, rapid growth, and changes in texture. Some women also have hair loss during or after pregnancy, or hair that feels dry or thin. Your hair will most likely return to normal after your baby is born. WHAT TO EXPECT AT YOUR PRENATAL VISITS During a routine prenatal visit: You will be weighed to make sure you and the fetus are growing normally. Your blood pressure will be taken. Your abdomen will be measured to track your baby's growth. The fetal heartbeat will be listened to. Any test results from the previous visit will be discussed. Your health care provider may ask you: How you are feeling. If you are feeling the baby move. If you have had any abnormal symptoms, such as leaking fluid, bleeding, severe headaches, or abdominal cramping. If you have any questions. Other tests that may be performed during your second  trimester include: Blood tests that check for: Low iron levels (anemia). Gestational diabetes (between 24 and 28 weeks). Rh antibodies. Urine tests to check for infections, diabetes, or protein in the urine. An ultrasound to confirm the proper growth and development of the baby. An amniocentesis to check for possible genetic problems. Fetal screens for spina bifida and Down syndrome. HOME CARE INSTRUCTIONS  Avoid all smoking, herbs, alcohol, and unprescribed drugs. These chemicals affect the formation and growth of the baby. Follow your health care provider's instructions regarding medicine use. There are medicines that are either safe or unsafe to take during pregnancy. Exercise only as directed by your health care provider. Experiencing uterine cramps is a good sign to stop exercising. Continue to eat regular, healthy meals. Wear a good support bra for breast tenderness. Do not use hot tubs, steam rooms, or saunas. Wear your seat belt at all times when driving. Avoid raw meat, uncooked cheese, cat litter boxes, and soil used by cats. These carry germs that can cause birth defects in the baby. Take your prenatal vitamins. Try taking a stool softener (if your health care provider approves) if you develop constipation. Eat more high-fiber foods, such as fresh vegetables or fruit and whole grains. Drink plenty of fluids to keep your urine clear or pale yellow. Take warm sitz baths to soothe any pain or discomfort caused by hemorrhoids. Use hemorrhoid cream if your health care provider approves. If you develop varicose veins, wear support hose. Elevate your feet for 15 minutes, 3-4 times a day. Limit salt in your diet. Avoid heavy lifting, wear low heel shoes, and practice good posture. Rest with your legs elevated if you have leg cramps or low back pain. Visit your dentist if you have not gone yet during your pregnancy. Use a soft toothbrush to brush your teeth and be gentle when you floss. A  sexual relationship may be continued unless your health care provider directs you otherwise. Continue to go to all your prenatal visits as directed by your health care provider. SEEK MEDICAL CARE IF:  You have dizziness. You have mild pelvic cramps, pelvic pressure, or nagging pain in the abdominal area. You have persistent nausea, vomiting, or diarrhea. You have a bad smelling vaginal discharge. You have pain with urination. SEEK IMMEDIATE MEDICAL CARE IF:  You have a fever. You are leaking fluid from your vagina. You have spotting or bleeding from your vagina. You have severe abdominal cramping or pain. You have rapid weight gain or loss. You have shortness of breath with chest pain. You  notice sudden or extreme swelling of your face, hands, ankles, feet, or legs. You have not felt your baby move in over an hour. You have severe headaches that do not go away with medicine. You have vision changes. Document Released: 01/13/2001 Document Revised: 01/24/2013 Document Reviewed: 03/22/2012 [facility] Patient Information 2015 [place], [place]. This information is not intended to replace advice given to you by your health care provider. Make sure you discuss any questions you have with your health care provider.

## 2022-05-11 NOTE — Progress Notes (Signed)
LOW-RISK PREGNANCY VISIT Patient name: Becky Garcia MRN [id number]  Date of birth: 11-07-1995 Chief Complaint:   Routine Prenatal Visit  History of Present Illness:   Becky Garcia is a 27 y.o. G1P0 female at [redacted]w[redacted]d with an Estimated Date of Delivery: 09/03/22 being seen today for ongoing management of a low-risk pregnancy.   Today she reports no complaints. Contractions: Not present. Vag. Bleeding: None.  Movement: Present. denies leaking of fluid.     03/02/2022    1:45 PM 08/25/2021    9:57 AM 07/25/2019    2:30 PM  Depression screen PHQ 2/9  Decreased Interest 0 0 0  Down, Depressed, Hopeless 0 0 0  PHQ - 2 Score 0 0 0  Altered sleeping 0 0 0  Tired, decreased energy 1 1 0  Change in appetite 0 0 0  Feeling bad or failure about yourself  0 0 0  Trouble concentrating 0 0 0  Moving slowly or fidgety/restless 0 0 0  Suicidal thoughts 0 0 0  PHQ-9 Score 1 1 0  Difficult doing work/chores   Not difficult at all        03/02/2022    1:45 PM 08/25/2021    9:59 AM 07/25/2019    2:30 PM  GAD 7 : Generalized Anxiety Score  Nervous, Anxious, on Edge 1 1 0  Control/stop worrying 0 0 0  Worry too much - different things 1 0 0  Trouble relaxing 0 0 0  Restless 0 0 0  Easily annoyed or irritable 0 0 0  Afraid - awful might happen 0 0 0  Total GAD 7 Score 2 1 0  Anxiety Difficulty   Not difficult at all      Review of Systems:   Pertinent items are noted in HPI Denies abnormal vaginal discharge w/ itching/odor/irritation, headaches, visual changes, shortness of breath, chest pain, abdominal pain, severe nausea/vomiting, or problems with urination or bowel movements unless otherwise stated above. Pertinent History Reviewed:  Reviewed past medical,surgical, social, obstetrical and family history.  Reviewed problem list, medications and allergies. Physical Assessment:   Vitals:   05/11/22 1602  BP: 115/69  Pulse: 78  Weight: 173 lb (78.5 kg)  Body mass index is 27.5  kg/m.        Physical Examination:   General appearance: Well appearing, and in no distress  Mental status: Alert, oriented to person, place, and time  Skin: Warm & dry  Cardiovascular: Normal heart rate noted  Respiratory: Normal respiratory effort, no distress  Abdomen: Soft, gravid, nontender  Pelvic: Cervical exam deferred         Extremities: Edema: None  Fetal Status: Fetal Heart Rate (bpm): 145 Fundal Height: 23 cm Movement: Present    Chaperone: N/A   No results found for this or any previous visit (from the past 24 hour(s)).  Assessment & Plan:  1) Low-risk pregnancy G1P0 at [redacted]w[redacted]d with an Estimated Date of Delivery: 09/03/22    Meds: No orders of the defined types were placed in this encounter.  Labs/procedures today: none  Plan:  Continue routine obstetrical care  Next visit: prefers will be in person for pn2     Reviewed: Preterm labor symptoms and general obstetric precautions including but not limited to vaginal bleeding, contractions, leaking of fluid and fetal movement were reviewed in detail with the patient.  All questions were answered. Does have home bp cuff. Office bp cuff given: not applicable. Check bp weekly, let us know if consistently >  140 and/or >90.  Follow-up: Return in about 4 weeks (around 06/08/2022) for LROB, PN2, CNM, in person.  No future appointments.  No orders of the defined types were placed in this encounter.  Cheral Marker CNM, [facility] 05/11/2022 4:35 PM

## 2022-05-25 ENCOUNTER — Encounter: Payer: Self-pay | Admitting: Women's Health

## 2022-06-08 ENCOUNTER — Other Ambulatory Visit: Payer: BC Managed Care – PPO

## 2022-06-08 ENCOUNTER — Encounter: Payer: Self-pay | Admitting: Women's Health

## 2022-06-08 ENCOUNTER — Ambulatory Visit (INDEPENDENT_AMBULATORY_CARE_PROVIDER_SITE_OTHER): Payer: BC Managed Care – PPO | Admitting: Women's Health

## 2022-06-08 VITALS — BP 117/74 | HR 84 | Wt 178.0 lb

## 2022-06-08 DIAGNOSIS — Z3A27 27 weeks gestation of pregnancy: Secondary | ICD-10-CM

## 2022-06-08 DIAGNOSIS — Z3402 Encounter for supervision of normal first pregnancy, second trimester: Secondary | ICD-10-CM

## 2022-06-08 DIAGNOSIS — Z23 Encounter for immunization: Secondary | ICD-10-CM | POA: Diagnosis not present

## 2022-06-08 DIAGNOSIS — Z131 Encounter for screening for diabetes mellitus: Secondary | ICD-10-CM

## 2022-06-08 NOTE — Progress Notes (Signed)
LOW-RISK PREGNANCY VISIT Patient name: Becky Garcia MRN [id number]  Date of birth: Jun 24, 1995 Chief Complaint:   Routine Prenatal Visit (PN2/ tdap today)  History of Present Illness:   Becky Garcia is a 27 y.o. G1P0 female at [redacted]w[redacted]d with an Estimated Date of Delivery: 09/03/22 being seen today for ongoing management of a low-risk pregnancy.   Today she reports round ligament pain, breasts starting to leak some- not necessarily at same time, but both are leaking some.  Contractions: Not present.  .  Movement: Present. denies leaking of fluid.     03/02/2022    1:45 PM 08/25/2021    9:57 AM 07/25/2019    2:30 PM  Depression screen PHQ 2/9  Decreased Interest 0 0 0  Down, Depressed, Hopeless 0 0 0  PHQ - 2 Score 0 0 0  Altered sleeping 0 0 0  Tired, decreased energy 1 1 0  Change in appetite 0 0 0  Feeling bad or failure about yourself  0 0 0  Trouble concentrating 0 0 0  Moving slowly or fidgety/restless 0 0 0  Suicidal thoughts 0 0 0  PHQ-9 Score 1 1 0  Difficult doing work/chores   Not difficult at all        03/02/2022    1:45 PM 08/25/2021    9:59 AM 07/25/2019    2:30 PM  GAD 7 : Generalized Anxiety Score  Nervous, Anxious, on Edge 1 1 0  Control/stop worrying 0 0 0  Worry too much - different things 1 0 0  Trouble relaxing 0 0 0  Restless 0 0 0  Easily annoyed or irritable 0 0 0  Afraid - awful might happen 0 0 0  Total GAD 7 Score 2 1 0  Anxiety Difficulty   Not difficult at all      Review of Systems:   Pertinent items are noted in HPI Denies abnormal vaginal discharge w/ itching/odor/irritation, headaches, visual changes, shortness of breath, chest pain, abdominal pain, severe nausea/vomiting, or problems with urination or bowel movements unless otherwise stated above. Pertinent History Reviewed:  Reviewed past medical,surgical, social, obstetrical and family history.  Reviewed problem list, medications and allergies. Physical Assessment:   Vitals:    06/08/22 0847  BP: 117/74  Pulse: 84  Weight: 178 lb (80.7 kg)  Body mass index is 28.3 kg/m.        Physical Examination:   General appearance: Well appearing, and in no distress  Mental status: Alert, oriented to person, place, and time  Skin: Warm & dry  Cardiovascular: Normal heart rate noted  Respiratory: Normal respiratory effort, no distress  Abdomen: Soft, gravid, nontender  Pelvic: Cervical exam deferred         Extremities: Edema: None  Fetal Status: Fetal Heart Rate (bpm): 140 Fundal Height: 27 cm Movement: Present    Chaperone: N/A   No results found for this or any previous visit (from the past 24 hour(s)).  Assessment & Plan:  1) Low-risk pregnancy G1P0 at [redacted]w[redacted]d with an Estimated Date of Delivery: 09/03/22   2) Anxiety, doing well on lexapro   Meds: No orders of the defined types were placed in this encounter.  Labs/procedures today: tdap and PN2  Plan:  Continue routine obstetrical care  Next visit: prefers in person    Reviewed: Preterm labor symptoms and general obstetric precautions including but not limited to vaginal bleeding, contractions, leaking of fluid and fetal movement were reviewed in detail with the patient.  All questions were answered. Does have home bp cuff. Office bp cuff given: not applicable. Check bp weekly, let us know if consistently >140 and/or >90.  Follow-up: Return in about 3 weeks (around 06/29/2022) for LROB, CNM, in person.  Future Appointments  Date Time Provider Department Center  07/01/2022  2:30 PM Arabella Merles, CNM [facility]    Orders Placed This Encounter  Procedures   Tdap vaccine greater than or equal to 7yo IM   Cheral Marker CNM, [facility] 06/08/2022 9:48 AM

## 2022-06-08 NOTE — Patient Instructions (Signed)
[place], thank you for choosing our office today! We appreciate the opportunity to meet your healthcare needs. You may receive a short survey by mail, e-mail, or through MyChart. If you are happy with your care we would appreciate if you could take just a few minutes to complete the survey questions. We read all of your comments and take your feedback very seriously. Thank you again for choosing our office.  [facility]  [facility] ([address] [place], [place] [address]) Entrance C, located off of [address] Free 24/7 valet parking   CLASSES: Go to [email or web address] to register for classes (childbirth, breastfeeding, waterbirth, infant CPR, daddy bootcamp, etc.)  Call the office ([phone]) or go to [facility] if: You begin to have strong, frequent contractions Your water breaks.  Sometimes it is a big gush of fluid, sometimes it is just a trickle that keeps getting your panties wet or running down your legs You have vaginal bleeding.  It is normal to have a small amount of spotting if your cervix was checked.  You don't feel your baby moving like normal.  If you don't, get you something to eat and drink and lay down and focus on feeling your baby move.   If your baby is still not moving like normal, you should call the office or go to [facility].  Call the office ([phone]) or go to [facility] for these signs of pre-eclampsia: Severe headache that does not go away with Tylenol Visual changes- seeing spots, double, blurred vision Pain under your right breast or upper abdomen that does not go away with Tums or heartburn medicine Nausea and/or vomiting Severe swelling in your hands, feet, and face   Tdap Vaccine It is recommended that you get the Tdap vaccine during the third trimester of EACH pregnancy to help protect your baby from getting pertussis (whooping cough) 27-36 weeks is the BEST time to do  this so that you can pass the protection on to your baby. During pregnancy is better than after pregnancy, but if you are unable to get it during pregnancy it will be offered at the hospital.  You can get this vaccine with us, at the health department, your family doctor, or some local pharmacies Everyone who will be around your baby should also be up-to-date on their vaccines before the baby comes. Adults (who are not pregnant) only need 1 dose of Tdap during adulthood.   [place] Pediatricians/Family Doctors [facility] ([facility]): [address], [phone]           [facility]: [address], [phone]                [facility] ([facility]): [address], [phone] (call to ask if accepting patients) [facility]: [address], [place], [phone]    [place] Pediatricians/Family Doctors [facility] ([facility]): [address], [phone] [facility]: [address], [phone] [facility]: [address], [phone]  [place] [facility]  [facility] ([facility]): [phone] [facility]: [address], [phone]   [place] [facility]: [address], [phone]  [place] [facility]  [facility]: [address], [phone]  Home Blood Pressure Monitoring for Patients   Your provider has recommended that you check your   blood pressure (BP) at least once a week at home. If you do not have a blood pressure cuff at home, one will be provided for you. Contact your provider if you have not received your monitor within 1 week.   Helpful Tips for Accurate Home Blood Pressure Checks  Don't smoke, exercise, or drink caffeine 30 minutes before checking your BP Use the restroom before checking your BP (a full bladder can raise your  pressure) Relax in a comfortable upright chair Feet on the ground Left arm resting comfortably on a flat surface at the level of your heart Legs uncrossed Back supported Sit quietly and don't talk Place the cuff on your bare arm Adjust snuggly, so that only two fingertips can fit between your skin and the top of the cuff Check 2 readings separated by at least one minute Keep a log of your BP readings For a visual, please reference this diagram: http://ccnc.care/bpdiagram  Provider Name: [facility]     Phone: [phone]  Zone 1: ALL CLEAR  Continue to monitor your symptoms:  BP reading is less than 140 (top number) or less than 90 (bottom number)  No right upper stomach pain No headaches or seeing spots No feeling nauseated or throwing up No swelling in face and hands  Zone 2: CAUTION Call your doctor's office for any of the following:  BP reading is greater than 140 (top number) or greater than 90 (bottom number)  Stomach pain under your ribs in the middle or right side Headaches or seeing spots Feeling nauseated or throwing up Swelling in face and hands  Zone 3: EMERGENCY  Seek immediate medical care if you have any of the following:  BP reading is greater than160 (top number) or greater than 110 (bottom number) Severe headaches not improving with Tylenol Serious difficulty catching your breath Any worsening symptoms from Zone 2   Third Trimester of Pregnancy The third trimester is from week 29 through week 42, months 7 through 9. The third trimester is a time when the fetus is growing rapidly. At the end of the ninth month, the fetus is about 20 inches in length and weighs 6-10 pounds.  BODY CHANGES Your body goes through many changes during pregnancy. The changes vary from woman to woman.  Your weight will continue to increase. You can expect to gain 25-35 pounds (11-16 kg) by the end of the pregnancy. You may begin to get stretch marks on your hips, abdomen,  and breasts. You may urinate more often because the fetus is moving lower into your pelvis and pressing on your bladder. You may develop or continue to have heartburn as a result of your pregnancy. You may develop constipation because certain hormones are causing the muscles that push waste through your intestines to slow down. You may develop hemorrhoids or swollen, bulging veins (varicose veins). You may have pelvic pain because of the weight gain and pregnancy hormones relaxing your joints between the bones in your pelvis. Backaches may result from overexertion of the muscles supporting your posture. You may have changes in your hair. These can include thickening of your hair, rapid growth, and changes in texture. Some women also have hair loss during or after pregnancy, or hair that feels dry or thin. Your hair will most likely return to normal after your baby is born. Your breasts will continue to grow and be tender. A yellow discharge may leak from your breasts called colostrum. Your belly button may stick out. You may   feel short of breath because of your expanding uterus. You may notice the fetus "dropping," or moving lower in your abdomen. You may have a bloody mucus discharge. This usually occurs a few days to a week before labor begins. Your cervix becomes thin and soft (effaced) near your due date. WHAT TO EXPECT AT YOUR PRENATAL EXAMS  You will have prenatal exams every 2 weeks until week 36. Then, you will have weekly prenatal exams. During a routine prenatal visit: You will be weighed to make sure you and the fetus are growing normally. Your blood pressure is taken. Your abdomen will be measured to track your baby's growth. The fetal heartbeat will be listened to. Any test results from the previous visit will be discussed. You may have a cervical check near your due date to see if you have effaced. At around 36 weeks, your caregiver will check your cervix. At the same time, your  caregiver will also perform a test on the secretions of the vaginal tissue. This test is to determine if a type of bacteria, Group B streptococcus, is present. Your caregiver will explain this further. Your caregiver may ask you: What your birth plan is. How you are feeling. If you are feeling the baby move. If you have had any abnormal symptoms, such as leaking fluid, bleeding, severe headaches, or abdominal cramping. If you have any questions. Other tests or screenings that may be performed during your third trimester include: Blood tests that check for low iron levels (anemia). Fetal testing to check the health, activity level, and growth of the fetus. Testing is done if you have certain medical conditions or if there are problems during the pregnancy. FALSE LABOR You may feel small, irregular contractions that eventually go away. These are called Braxton Hicks contractions, or false labor. Contractions may last for hours, days, or even weeks before true labor sets in. If contractions come at regular intervals, intensify, or become painful, it is best to be seen by your caregiver.  SIGNS OF LABOR  Menstrual-like cramps. Contractions that are 5 minutes apart or less. Contractions that start on the top of the uterus and spread down to the lower abdomen and back. A sense of increased pelvic pressure or back pain. A watery or bloody mucus discharge that comes from the vagina. If you have any of these signs before the 37th week of pregnancy, call your caregiver right away. You need to go to the hospital to get checked immediately. HOME CARE INSTRUCTIONS  Avoid all smoking, herbs, alcohol, and unprescribed drugs. These chemicals affect the formation and growth of the baby. Follow your caregiver's instructions regarding medicine use. There are medicines that are either safe or unsafe to take during pregnancy. Exercise only as directed by your caregiver. Experiencing uterine cramps is a good sign to  stop exercising. Continue to eat regular, healthy meals. Wear a good support bra for breast tenderness. Do not use hot tubs, steam rooms, or saunas. Wear your seat belt at all times when driving. Avoid raw meat, uncooked cheese, cat litter boxes, and soil used by cats. These carry germs that can cause birth defects in the baby. Take your prenatal vitamins. Try taking a stool softener (if your caregiver approves) if you develop constipation. Eat more high-fiber foods, such as fresh vegetables or fruit and whole grains. Drink plenty of fluids to keep your urine clear or pale yellow. Take warm sitz baths to soothe any pain or discomfort caused by hemorrhoids. Use hemorrhoid cream if   your caregiver approves. If you develop varicose veins, wear support hose. Elevate your feet for 15 minutes, 3-4 times a day. Limit salt in your diet. Avoid heavy lifting, wear low heal shoes, and practice good posture. Rest a lot with your legs elevated if you have leg cramps or low back pain. Visit your dentist if you have not gone during your pregnancy. Use a soft toothbrush to brush your teeth and be gentle when you floss. A sexual relationship may be continued unless your caregiver directs you otherwise. Do not travel far distances unless it is absolutely necessary and only with the approval of your caregiver. Take prenatal classes to understand, practice, and ask questions about the labor and delivery. Make a trial run to the hospital. Pack your hospital bag. Prepare the baby's nursery. Continue to go to all your prenatal visits as directed by your caregiver. SEEK MEDICAL CARE IF: You are unsure if you are in labor or if your water has broken. You have dizziness. You have mild pelvic cramps, pelvic pressure, or nagging pain in your abdominal area. You have persistent nausea, vomiting, or diarrhea. You have a bad smelling vaginal discharge. You have pain with urination. SEEK IMMEDIATE MEDICAL CARE IF:  You  have a fever. You are leaking fluid from your vagina. You have spotting or bleeding from your vagina. You have severe abdominal cramping or pain. You have rapid weight loss or gain. You have shortness of breath with chest pain. You notice sudden or extreme swelling of your face, hands, ankles, feet, or legs. You have not felt your baby move in over an hour. You have severe headaches that do not go away with medicine. You have vision changes. Document Released: 01/13/2001 Document Revised: 01/24/2013 Document Reviewed: 03/22/2012 Exit[employer] Patient Information 2015 [employer]. This information is not intended to replace advice given to you by your health care provider. Make sure you discuss any questions you have with your health care provider.       

## 2022-06-09 ENCOUNTER — Encounter: Payer: Self-pay | Admitting: Women's Health

## 2022-06-09 LAB — CBC
Hematocrit: 32.9 % — ABNORMAL LOW (ref 34.0–46.6)
Hemoglobin: 10.8 g/dL — ABNORMAL LOW (ref 11.1–15.9)
MCH: 28.8 pg (ref 26.6–33.0)
MCHC: 32.8 g/dL (ref 31.5–35.7)
MCV: 88 fL (ref 79–97)
Platelets: 146 10*3/uL — ABNORMAL LOW (ref 150–450)
RBC: 3.75 x10E6/uL — ABNORMAL LOW (ref 3.77–5.28)
RDW: 12.1 % (ref 11.7–15.4)
WBC: 9.6 10*3/uL (ref 3.4–10.8)

## 2022-06-09 LAB — GLUCOSE TOLERANCE, 2 HOURS W/ 1HR
Glucose, 1 hour: 135 mg/dL (ref 70–179)
Glucose, 2 hour: 113 mg/dL (ref 70–152)
Glucose, Fasting: 79 mg/dL (ref 70–91)

## 2022-06-09 LAB — HIV ANTIBODY (ROUTINE TESTING W REFLEX): HIV Screen 4th Generation wRfx: NONREACTIVE

## 2022-06-09 LAB — RPR: RPR Ser Ql: NONREACTIVE

## 2022-06-09 LAB — ANTIBODY SCREEN: Antibody Screen: NEGATIVE

## 2022-07-01 ENCOUNTER — Ambulatory Visit (INDEPENDENT_AMBULATORY_CARE_PROVIDER_SITE_OTHER): Payer: BC Managed Care – PPO | Admitting: Advanced Practice Midwife

## 2022-07-01 ENCOUNTER — Encounter: Payer: Self-pay | Admitting: Advanced Practice Midwife

## 2022-07-01 VITALS — BP 112/72 | HR 86 | Wt 181.0 lb

## 2022-07-01 DIAGNOSIS — Z3401 Encounter for supervision of normal first pregnancy, first trimester: Secondary | ICD-10-CM

## 2022-07-01 DIAGNOSIS — Z3A30 30 weeks gestation of pregnancy: Secondary | ICD-10-CM

## 2022-07-01 DIAGNOSIS — Z3403 Encounter for supervision of normal first pregnancy, third trimester: Secondary | ICD-10-CM

## 2022-07-01 NOTE — Patient Instructions (Signed)
Becky Garcia, thank you for choosing our office today! We appreciate the opportunity to meet your healthcare needs. You may receive a short survey by mail, e-mail, or through MyChart. If you are happy with your care we would appreciate if you could take just a few minutes to complete the survey questions. We read all of your comments and take your feedback very seriously. Thank you again for choosing our office.  [facility] ([address] [place], [place] [address]) Entrance C, located off of [address] Free 24/7 valet parking   CLASSES: Go to [email or web address] to register for classes (childbirth, breastfeeding, waterbirth, infant CPR, daddy bootcamp, etc.)  Call the office ([phone]) or go to [facility] if: You begin to have strong, frequent contractions Your water breaks.  Sometimes it is a big gush of fluid, sometimes it is just a trickle that keeps getting your panties wet or running down your legs You have vaginal bleeding.  It is normal to have a small amount of spotting if your cervix was checked.  You don't feel your baby moving like normal.  If you don't, get you something to eat and drink and lay down and focus on feeling your baby move.   If your baby is still not moving like normal, you should call the office or go to [facility].  Call the office ([phone]) or go to [facility] for these signs of pre-eclampsia: Severe headache that does not go away with Tylenol Visual changes- seeing spots, double, blurred vision Pain under your right breast or upper abdomen that does not go away with Tums or heartburn medicine Nausea and/or vomiting Severe swelling in your hands, feet, and face   Tdap Vaccine It is recommended that you get the Tdap vaccine during the third trimester of EACH pregnancy to help protect your baby from getting pertussis (whooping cough) 27-36 weeks is the BEST time to do  this so that you can pass the protection on to your baby. During pregnancy is better than after pregnancy, but if you are unable to get it during pregnancy it will be offered at the hospital.  You can get this vaccine with us, at the health department, your family doctor, or some local pharmacies Everyone who will be around your baby should also be up-to-date on their vaccines before the baby comes. Adults (who are not pregnant) only need 1 dose of Tdap during adulthood.   [place] Pediatricians/Family Doctors [facility] ([facility]): [address], [phone]           [facility]: [address], [phone]                [facility] ([facility]): [address], [phone] (call to ask if accepting patients) [facility]: [address], [place], [phone]    [place] Pediatricians/Family Doctors [facility] ([facility]): [address], [phone] [facility]: [address], [phone] [facility]: [address], [phone]  [place] [facility]  [facility] ([facility]): [phone] [facility]: [address], [phone]   [place] Family Doctors [facility]: [address], [phone]  [place] [facility]  [facility]: [address], [phone]  Home Blood Pressure Monitoring for Patients   Your provider has recommended that you check your   blood pressure (BP) at least once a week at home. If you do not have a blood pressure cuff at home, one will be provided for you. Contact your provider if you have not received your monitor within 1 week.   Helpful Tips for Accurate Home Blood Pressure Checks  Don't smoke, exercise, or drink caffeine 30 minutes before checking your BP Use the restroom before checking your BP (a full bladder can raise your  pressure) Relax in a comfortable upright chair Feet on the ground Left arm resting comfortably on a flat surface at the level of your heart Legs uncrossed Back supported Sit quietly and don't talk Place the cuff on your bare arm Adjust snuggly, so that only two fingertips can fit between your skin and the top of the cuff Check 2 readings separated by at least one minute Keep a log of your BP readings For a visual, please reference this diagram: http://ccnc.care/bpdiagram  Provider Name: [facility]     Phone: [phone]  Zone 1: ALL CLEAR  Continue to monitor your symptoms:  BP reading is less than 140 (top number) or less than 90 (bottom number)  No right upper stomach pain No headaches or seeing spots No feeling nauseated or throwing up No swelling in face and hands  Zone 2: CAUTION Call your doctor's office for any of the following:  BP reading is greater than 140 (top number) or greater than 90 (bottom number)  Stomach pain under your ribs in the middle or right side Headaches or seeing spots Feeling nauseated or throwing up Swelling in face and hands  Zone 3: EMERGENCY  Seek immediate medical care if you have any of the following:  BP reading is greater than160 (top number) or greater than 110 (bottom number) Severe headaches not improving with Tylenol Serious difficulty catching your breath Any worsening symptoms from Zone 2   Third Trimester of Pregnancy The third trimester is from week 29 through week 42, months 7 through 9. The third trimester is a time when the fetus is growing rapidly. At the end of the ninth month, the fetus is about 20 inches in length and weighs 6-10 pounds.  BODY CHANGES Your body goes through many changes during pregnancy. The changes vary from woman to woman.  Your weight will continue to increase. You can expect to gain 25-35 pounds (11-16 kg) by the end of the pregnancy. You may begin to get stretch marks on your hips, abdomen,  and breasts. You may urinate more often because the fetus is moving lower into your pelvis and pressing on your bladder. You may develop or continue to have heartburn as a result of your pregnancy. You may develop constipation because certain hormones are causing the muscles that push waste through your intestines to slow down. You may develop hemorrhoids or swollen, bulging veins (varicose veins). You may have pelvic pain because of the weight gain and pregnancy hormones relaxing your joints between the bones in your pelvis. Backaches may result from overexertion of the muscles supporting your posture. You may have changes in your hair. These can include thickening of your hair, rapid growth, and changes in texture. Some women also have hair loss during or after pregnancy, or hair that feels dry or thin. Your hair will most likely return to normal after your baby is born. Your breasts will continue to grow and be tender. A yellow discharge may leak from your breasts called colostrum. Your belly button may stick out. You may   feel short of breath because of your expanding uterus. You may notice the fetus "dropping," or moving lower in your abdomen. You may have a bloody mucus discharge. This usually occurs a few days to a week before labor begins. Your cervix becomes thin and soft (effaced) near your due date. WHAT TO EXPECT AT YOUR PRENATAL EXAMS  You will have prenatal exams every 2 weeks until week 36. Then, you will have weekly prenatal exams. During a routine prenatal visit: You will be weighed to make sure you and the fetus are growing normally. Your blood pressure is taken. Your abdomen will be measured to track your baby's growth. The fetal heartbeat will be listened to. Any test results from the previous visit will be discussed. You may have a cervical check near your due date to see if you have effaced. At around 36 weeks, your caregiver will check your cervix. At the same time, your  caregiver will also perform a test on the secretions of the vaginal tissue. This test is to determine if a type of bacteria, Group B streptococcus, is present. Your caregiver will explain this further. Your caregiver may ask you: What your birth plan is. How you are feeling. If you are feeling the baby move. If you have had any abnormal symptoms, such as leaking fluid, bleeding, severe headaches, or abdominal cramping. If you have any questions. Other tests or screenings that may be performed during your third trimester include: Blood tests that check for low iron levels (anemia). Fetal testing to check the health, activity level, and growth of the fetus. Testing is done if you have certain medical conditions or if there are problems during the pregnancy. FALSE LABOR You may feel small, irregular contractions that eventually go away. These are called Braxton Hicks contractions, or false labor. Contractions may last for hours, days, or even weeks before true labor sets in. If contractions come at regular intervals, intensify, or become painful, it is best to be seen by your caregiver.  SIGNS OF LABOR  Menstrual-like cramps. Contractions that are 5 minutes apart or less. Contractions that start on the top of the uterus and spread down to the lower abdomen and back. A sense of increased pelvic pressure or back pain. A watery or bloody mucus discharge that comes from the vagina. If you have any of these signs before the 37th week of pregnancy, call your caregiver right away. You need to go to the hospital to get checked immediately. HOME CARE INSTRUCTIONS  Avoid all smoking, herbs, alcohol, and unprescribed drugs. These chemicals affect the formation and growth of the baby. Follow your caregiver's instructions regarding medicine use. There are medicines that are either safe or unsafe to take during pregnancy. Exercise only as directed by your caregiver. Experiencing uterine cramps is a good sign to  stop exercising. Continue to eat regular, healthy meals. Wear a good support bra for breast tenderness. Do not use hot tubs, steam rooms, or saunas. Wear your seat belt at all times when driving. Avoid raw meat, uncooked cheese, cat litter boxes, and soil used by cats. These carry germs that can cause birth defects in the baby. Take your prenatal vitamins. Try taking a stool softener (if your caregiver approves) if you develop constipation. Eat more high-fiber foods, such as fresh vegetables or fruit and whole grains. Drink plenty of fluids to keep your urine clear or pale yellow. Take warm sitz baths to soothe any pain or discomfort caused by hemorrhoids. Use hemorrhoid cream if   your caregiver approves. If you develop varicose veins, wear support hose. Elevate your feet for 15 minutes, 3-4 times a day. Limit salt in your diet. Avoid heavy lifting, wear low heal shoes, and practice good posture. Rest a lot with your legs elevated if you have leg cramps or low back pain. Visit your dentist if you have not gone during your pregnancy. Use a soft toothbrush to brush your teeth and be gentle when you floss. A sexual relationship may be continued unless your caregiver directs you otherwise. Do not travel far distances unless it is absolutely necessary and only with the approval of your caregiver. Take prenatal classes to understand, practice, and ask questions about the labor and delivery. Make a trial run to the hospital. Pack your hospital bag. Prepare the baby's nursery. Continue to go to all your prenatal visits as directed by your caregiver. SEEK MEDICAL CARE IF: You are unsure if you are in labor or if your water has broken. You have dizziness. You have mild pelvic cramps, pelvic pressure, or nagging pain in your abdominal area. You have persistent nausea, vomiting, or diarrhea. You have a bad smelling vaginal discharge. You have pain with urination. SEEK IMMEDIATE MEDICAL CARE IF:  You  have a fever. You are leaking fluid from your vagina. You have spotting or bleeding from your vagina. You have severe abdominal cramping or pain. You have rapid weight loss or gain. You have shortness of breath with chest pain. You notice sudden or extreme swelling of your face, hands, ankles, feet, or legs. You have not felt your baby move in over an hour. You have severe headaches that do not go away with medicine. You have vision changes. Document Released: 01/13/2001 Document Revised: 01/24/2013 Document Reviewed: 03/22/2012 ExitCare Patient Information 2015 [employer]. This information is not intended to replace advice given to you by your health care provider. Make sure you discuss any questions you have with your health care provider.       

## 2022-07-01 NOTE — Progress Notes (Signed)
   LOW-RISK PREGNANCY VISIT Patient name: Becky Garcia MRN [id number]  Date of birth: 07/28/1995 Chief Complaint:   Routine Prenatal Visit  History of Present Illness:   Becky Garcia is a 27 y.o. G1P0 female at [redacted]w[redacted]d with an Estimated Date of Delivery: 09/03/22 being seen today for ongoing management of a low-risk pregnancy.  Today she reports no complaints. Contractions: Not present.  .  Movement: Present. denies leaking of fluid. Review of Systems:   Pertinent items are noted in HPI Denies abnormal vaginal discharge w/ itching/odor/irritation, headaches, visual changes, shortness of breath, chest pain, abdominal pain, severe nausea/vomiting, or problems with urination or bowel movements unless otherwise stated above. Pertinent History Reviewed:  Reviewed past medical,surgical, social, obstetrical and family history.  Reviewed problem list, medications and allergies. Physical Assessment:   Vitals:   07/01/22 1427  BP: 112/72  Pulse: 86  Weight: 181 lb (82.1 kg)  Body mass index is 28.78 kg/m.        Physical Examination:   General appearance: Well appearing, and in no distress  Mental status: Alert, oriented to person, place, and time  Skin: Warm & dry  Cardiovascular: Normal heart rate noted  Respiratory: Normal respiratory effort, no distress  Abdomen: Soft, gravid, nontender  Pelvic: Cervical exam deferred         Extremities: Edema: None  Fetal Status: Fetal Heart Rate (bpm): 154 Fundal Height: 31 cm Movement: Present    No results found for this or any previous visit (from the past 24 hour(s)).  Assessment & Plan:  1) Low-risk pregnancy G1P0 at [redacted]w[redacted]d with an Estimated Date of Delivery: 09/03/22   2) Platelets slightly low (146), will recheck w next visit   Meds: No orders of the defined types were placed in this encounter.  Labs/procedures today: none  Plan:  Continue routine obstetrical care   Reviewed: Preterm labor symptoms and general obstetric precautions  including but not limited to vaginal bleeding, contractions, leaking of fluid and fetal movement were reviewed in detail with the patient.  All questions were answered. Has home bp cuff. Check bp weekly, let us know if >140/90.   Follow-up: Return in about 2 weeks (around 07/15/2022) for LROB, in person; and CBC.  No orders of the defined types were placed in this encounter.  Arabella Merles CNM 07/01/2022 2:52 PM

## 2022-07-07 ENCOUNTER — Other Ambulatory Visit: Payer: Self-pay | Admitting: Women's Health

## 2022-07-16 ENCOUNTER — Ambulatory Visit (INDEPENDENT_AMBULATORY_CARE_PROVIDER_SITE_OTHER): Payer: BC Managed Care – PPO | Admitting: Advanced Practice Midwife

## 2022-07-16 ENCOUNTER — Encounter: Payer: Self-pay | Admitting: Advanced Practice Midwife

## 2022-07-16 VITALS — BP 103/67 | HR 78 | Wt 187.0 lb

## 2022-07-16 DIAGNOSIS — Z3401 Encounter for supervision of normal first pregnancy, first trimester: Secondary | ICD-10-CM

## 2022-07-16 DIAGNOSIS — O99113 Other diseases of the blood and blood-forming organs and certain disorders involving the immune mechanism complicating pregnancy, third trimester: Secondary | ICD-10-CM

## 2022-07-16 DIAGNOSIS — D696 Thrombocytopenia, unspecified: Secondary | ICD-10-CM

## 2022-07-16 DIAGNOSIS — Z3A33 33 weeks gestation of pregnancy: Secondary | ICD-10-CM

## 2022-07-16 NOTE — Progress Notes (Signed)
   LOW-RISK PREGNANCY VISIT Patient name: Becky Garcia MRN [id number]  Date of birth: 01-02-1996 Chief Complaint:   No chief complaint on file.  History of Present Illness:   Becky Garcia is a 27 y.o. G1P0 female at [redacted]w[redacted]d with an Estimated Date of Delivery: 09/03/22 being seen today for ongoing management of a low-risk pregnancy.  Today she reports no complaints. Contractions: Not present.  .  Movement: Present. denies leaking of fluid. Review of Systems:   Pertinent items are noted in HPI Denies abnormal vaginal discharge w/ itching/odor/irritation, headaches, visual changes, shortness of breath, chest pain, abdominal pain, severe nausea/vomiting, or problems with urination or bowel movements unless otherwise stated above. Pertinent History Reviewed:  Reviewed past medical,surgical, social, obstetrical and family history.  Reviewed problem list, medications and allergies. Physical Assessment:   Vitals:   07/16/22 1408  BP: 103/67  Pulse: 78  Weight: 187 lb (84.8 kg)  Body mass index is 29.73 kg/m.        Physical Examination:   General appearance: Well appearing, and in no distress  Mental status: Alert, oriented to person, place, and time  Skin: Warm & dry  Cardiovascular: Normal heart rate noted  Respiratory: Normal respiratory effort, no distress  Abdomen: Soft, gravid, nontender  Pelvic: Cervical exam deferred         Extremities: Edema: Trace  Fetal Status: Fetal Heart Rate (bpm): 148 Fundal Height: 34 cm Movement: Present    Chaperone:  N/A    No results found for this or any previous visit (from the past 24 hour(s)).  Assessment & Plan:    Pregnancy: G1P0 at [redacted]w[redacted]d 1. Encounter for supervision of normal first pregnancy in first trimester   2. Thrombocytopenia during pregnancy (HCC)  - CBC  3. [redacted] weeks gestation of pregnancy      Meds: No orders of the defined types were placed in this encounter.  Labs/procedures today: CBC  Plan:  Continue routine  obstetrical care  Next visit: prefers in person    Reviewed: Preterm labor symptoms and general obstetric precautions including but not limited to vaginal bleeding, contractions, leaking of fluid and fetal movement were reviewed in detail with the patient.  All questions were answered. Has home bp cuff. Check bp weekly, let us know if >140/90.   Follow-up: Return in about 2 weeks (around 07/30/2022) for LROB.  No future appointments.  Orders Placed This Encounter  Procedures   CBC   Jacklyn Shell DNP, CNM 07/16/2022 2:55 PM

## 2022-07-16 NOTE — Patient Instructions (Addendum)
Becky Garcia, I greatly value your feedback.  If you receive a survey following your visit with [place] today, we appreciate you taking the time to fill it out.  Thanks, Cathie Beams, DNP, CNM  [facility] HAS MOVED!!! It is now [facility] ([address] [place], [place] [address]) Entrance located off of [address] Free 24/7 valet parking   Go to [email or web address] to register for FREE online childbirth classes    Call the office [phone]) or go to [facility] if: You begin to have strong, frequent contractions Your water breaks.  Sometimes it is a big gush of fluid, sometimes it is just a trickle that keeps getting your panties wet or running down your legs You have vaginal bleeding.  It is normal to have a small amount of spotting if your cervix was checked.  You don't feel your baby moving like normal.  If you don't, get you something to eat and drink and lay down and focus on feeling your baby move.  You should feel at least 10 movements in 2 hours.  If you don't, you should call the office or go to [facility].   Home Blood Pressure Monitoring for Patients   Your provider has recommended that you check your blood pressure (BP) at least once a week at home. If you do not have a blood pressure cuff at home, one will be provided for you. Contact your provider if you have not received your monitor within 1 week.   Helpful Tips for Accurate Home Blood Pressure Checks  Don't smoke, exercise, or drink caffeine 30 minutes before checking your BP Use the restroom before checking your BP (a full bladder can raise your pressure) Relax in a comfortable upright chair Feet on the ground Left arm resting comfortably on a flat surface at the level of your heart Legs uncrossed Back supported Sit quietly and don't talk Place the cuff on your bare arm Adjust snuggly, so that only two fingertips can fit between your skin and the top of  the cuff Check 2 readings separated by at least one minute Keep a log of your BP readings For a visual, please reference this diagram: http://ccnc.care/bpdiagram  Provider Name: [facility]     Phone: [phone]  Zone 1: ALL CLEAR  Continue to monitor your symptoms:  BP reading is less than 140 (top number) or less than 90 (bottom number)  No right upper stomach pain No headaches or seeing spots No feeling nauseated or throwing up No swelling in face and hands  Zone 2: CAUTION Call your doctor's office for any of the following:  BP reading is greater than 140 (top number) or greater than 90 (bottom number)  Stomach pain under your ribs in the middle or right side Headaches or seeing spots Feeling nauseated or throwing up Swelling in face and hands  Zone 3: EMERGENCY  Seek immediate medical care if you have any of the following:  BP reading is greater than160 (top number) or greater than 110 (bottom number) Severe headaches not improving with Tylenol Serious difficulty catching your breath Any worsening symptoms from Zone 2   Kinesiology taping for pregnancy:  Youtube has good vidoes of "how tos" for lower back, pelvic, hip pain; swelling of feet, etc

## 2022-07-17 LAB — CBC
Hematocrit: 32.4 % — ABNORMAL LOW (ref 34.0–46.6)
Hemoglobin: 11 g/dL — ABNORMAL LOW (ref 11.1–15.9)
MCH: 29 pg (ref 26.6–33.0)
MCHC: 34 g/dL (ref 31.5–35.7)
MCV: 86 fL (ref 79–97)
Platelets: 154 10*3/uL (ref 150–450)
RBC: 3.79 x10E6/uL (ref 3.77–5.28)
RDW: 12.4 % (ref 11.7–15.4)
WBC: 9.4 10*3/uL (ref 3.4–10.8)

## 2022-07-29 ENCOUNTER — Encounter: Payer: Self-pay | Admitting: Obstetrics and Gynecology

## 2022-07-29 ENCOUNTER — Ambulatory Visit (INDEPENDENT_AMBULATORY_CARE_PROVIDER_SITE_OTHER): Payer: BC Managed Care – PPO | Admitting: Obstetrics and Gynecology

## 2022-07-29 VITALS — BP 107/69 | HR 83 | Wt 187.0 lb

## 2022-07-29 DIAGNOSIS — Z3A34 34 weeks gestation of pregnancy: Secondary | ICD-10-CM

## 2022-07-29 DIAGNOSIS — Z3403 Encounter for supervision of normal first pregnancy, third trimester: Secondary | ICD-10-CM

## 2022-07-29 DIAGNOSIS — O99113 Other diseases of the blood and blood-forming organs and certain disorders involving the immune mechanism complicating pregnancy, third trimester: Secondary | ICD-10-CM

## 2022-07-29 DIAGNOSIS — D696 Thrombocytopenia, unspecified: Secondary | ICD-10-CM

## 2022-07-29 DIAGNOSIS — O99119 Other diseases of the blood and blood-forming organs and certain disorders involving the immune mechanism complicating pregnancy, unspecified trimester: Secondary | ICD-10-CM

## 2022-07-29 NOTE — Progress Notes (Signed)
Subjective:  Becky Garcia is a 27 y.o. G1P0 at [redacted]w[redacted]d being seen today for ongoing prenatal care.  She is currently monitored for the following issues for this low-risk pregnancy and has Anxiety; Supervision of normal first pregnancy; Fibroid; and Thrombocytopenia during pregnancy (HCC) on their problem list.  Patient reports  general discomforts of pregnancy .  Contractions: Not present.  .  Movement: Present. Denies leaking of fluid.   The following portions of the patient's history were reviewed and updated as appropriate: allergies, current medications, past family history, past medical history, past social history, past surgical history and problem list. Problem list updated.  Objective:   Vitals:   07/29/22 1026  BP: 107/69  Pulse: 83  Weight: 187 lb (84.8 kg)    Fetal Status:     Movement: Present     General:  Alert, oriented and cooperative. Patient is in no acute distress.  Skin: Skin is warm and dry. No rash noted.   Cardiovascular: Normal heart rate noted  Respiratory: Normal respiratory effort, no problems with respiration noted  Abdomen: Soft, gravid, appropriate for gestational age. Pain/Pressure: Absent     Pelvic:  Cervical exam deferred        Extremities: Normal range of motion.     Mental Status: Normal mood and affect. Normal behavior. Normal judgment and thought content.   Urinalysis:      Assessment and Plan:  Pregnancy: G1P0 at [redacted]w[redacted]d  1. Encounter for supervision of normal first pregnancy in third trimester Stable GBS next visit  2. Thrombocytopenia during pregnancy (HCC) Last plt #, normal  Preterm labor symptoms and general obstetric precautions including but not limited to vaginal bleeding, contractions, leaking of fluid and fetal movement were reviewed in detail with the patient. Please refer to After Visit Summary for other counseling recommendations.  Return in about 2 weeks (around 08/12/2022) for OB visit, face to face, any provider.   Hermina Staggers, MD

## 2022-08-13 ENCOUNTER — Other Ambulatory Visit (HOSPITAL_COMMUNITY): Admission: RE | Admit: 2022-08-13 | Payer: BC Managed Care – PPO | Source: Ambulatory Visit

## 2022-08-13 ENCOUNTER — Ambulatory Visit (INDEPENDENT_AMBULATORY_CARE_PROVIDER_SITE_OTHER): Payer: BC Managed Care – PPO | Admitting: Advanced Practice Midwife

## 2022-08-13 VITALS — BP 120/72 | HR 89 | Wt 194.0 lb

## 2022-08-13 DIAGNOSIS — Z3403 Encounter for supervision of normal first pregnancy, third trimester: Secondary | ICD-10-CM | POA: Diagnosis present

## 2022-08-13 DIAGNOSIS — D696 Thrombocytopenia, unspecified: Secondary | ICD-10-CM

## 2022-08-13 DIAGNOSIS — Z3A37 37 weeks gestation of pregnancy: Secondary | ICD-10-CM

## 2022-08-13 DIAGNOSIS — O99113 Other diseases of the blood and blood-forming organs and certain disorders involving the immune mechanism complicating pregnancy, third trimester: Secondary | ICD-10-CM

## 2022-08-13 DIAGNOSIS — O99119 Other diseases of the blood and blood-forming organs and certain disorders involving the immune mechanism complicating pregnancy, unspecified trimester: Secondary | ICD-10-CM

## 2022-08-13 NOTE — Progress Notes (Signed)
   LOW-RISK PREGNANCY VISIT Patient name: Becky Garcia MRN [id number]  Date of birth: Dec 21, 1995 Chief Complaint:   Routine Prenatal Visit  History of Present Illness:   Becky Garcia is a 27 y.o. G1P0 female at [redacted]w[redacted]d with an Estimated Date of Delivery: 09/03/22 being seen today for ongoing management of a low-risk pregnancy.  Today she reports no complaints . Contractions: Not present.  .  Movement: Present. denies leaking of fluid. Review of Systems:   Pertinent items are noted in HPI Denies abnormal vaginal discharge w/ itching/odor/irritation, headaches, visual changes, shortness of breath, chest pain, abdominal pain, severe nausea/vomiting, or problems with urination or bowel movements unless otherwise stated above. Pertinent History Reviewed:  Reviewed past medical,surgical, social, obstetrical and family history.  Reviewed problem list, medications and allergies. Physical Assessment:   Vitals:   08/13/22 0832  BP: 120/72  Pulse: 89  Weight: 194 lb (88 kg)  Body mass index is 30.84 kg/m.        Physical Examination:   General appearance: Well appearing, and in no distress  Mental status: Alert, oriented to person, place, and time  Skin: Warm & dry  Cardiovascular: Normal heart rate noted  Respiratory: Normal respiratory effort, no distress  Abdomen: Soft, gravid, nontender.  VTX confirmed by bedside US  Pelvic: Cervical exam deferred  declined       Extremities:    Fetal Status: Fetal Heart Rate (bpm): 135 Fundal Height: 35 cm Movement: Present Presentation: Vertex  Chaperone:   Becky Garcia, CMA     No results found for this or any previous visit (from the past 24 hour(s)).  Assessment & Plan:    Pregnancy: G1P0 at [redacted]w[redacted]d 1. [redacted] weeks gestation of pregnancy   2. Encounter for supervision of normal first pregnancy in third trimester   3. Thrombocytopenia during pregnancy (HCC) Platelets 154 last month     Meds: No orders of the defined types were placed in this  encounter.  Labs/procedures today: GBS, GC, CHL  Plan:  Continue routine obstetrical care  Next visit: prefers in person    Reviewed: Term labor symptoms and general obstetric precautions including but not limited to vaginal bleeding, contractions, leaking of fluid and fetal movement were reviewed in detail with the patient.  All questions were answered. Has home bp cuff.. Check bp weekly, let us know if >140/90.   Follow-up: Return for As scheduled.  Future Appointments  Date Time Provider Department Center  08/20/2022  3:50 PM Jacklyn Shell, CNM [facility]  08/27/2022  8:30 AM Jacklyn Shell, CNM [facility]  09/03/2022  8:30 AM Cresenzo-Dishmon, Scarlette Calico, CNM [facility]    Orders Placed This Encounter  Procedures   Culture, beta strep (group b only)   Jacklyn Shell DNP, CNM 08/13/2022 9:08 AM

## 2022-08-13 NOTE — Patient Instructions (Signed)

## 2022-08-14 LAB — CERVICOVAGINAL ANCILLARY ONLY
Chlamydia: NEGATIVE
Comment: NEGATIVE
Comment: NORMAL
Neisseria Gonorrhea: NEGATIVE

## 2022-08-17 LAB — CULTURE, BETA STREP (GROUP B ONLY): Strep Gp B Culture: NEGATIVE

## 2022-08-20 ENCOUNTER — Ambulatory Visit: Payer: BC Managed Care – PPO | Admitting: Advanced Practice Midwife

## 2022-08-20 ENCOUNTER — Encounter: Payer: Self-pay | Admitting: Advanced Practice Midwife

## 2022-08-20 VITALS — BP 112/74 | HR 86 | Wt 198.4 lb

## 2022-08-20 DIAGNOSIS — Z3403 Encounter for supervision of normal first pregnancy, third trimester: Secondary | ICD-10-CM

## 2022-08-20 DIAGNOSIS — Z3A38 38 weeks gestation of pregnancy: Secondary | ICD-10-CM

## 2022-08-20 NOTE — Progress Notes (Signed)
   LOW-RISK PREGNANCY VISIT Patient name: Becky Garcia MRN [id number]  Date of birth: 29-May-1995 Chief Complaint:   Routine Prenatal Visit  History of Present Illness:   Becky Garcia is a 27 y.o. G1P0 female at [redacted]w[redacted]d with an Estimated Date of Delivery: 09/03/22 being seen today for ongoing management of a low-risk pregnancy.  Today she reports no complaints. Contractions: Not present.  .  Movement: Present. denies leaking of fluid. Review of Systems:   Pertinent items are noted in HPI Denies abnormal vaginal discharge w/ itching/odor/irritation, headaches, visual changes, shortness of breath, chest pain, abdominal pain, severe nausea/vomiting, or problems with urination or bowel movements unless otherwise stated above. Pertinent History Reviewed:  Reviewed past medical,surgical, social, obstetrical and family history.  Reviewed problem list, medications and allergies. Physical Assessment:   Vitals:   08/20/22 1558  BP: 112/74  Pulse: 86  Weight: 198 lb 6.4 oz (90 kg)  Body mass index is 31.54 kg/m.        Physical Examination:   General appearance: Well appearing, and in no distress  Mental status: Alert, oriented to person, place, and time  Skin: Warm & dry  Cardiovascular: Normal heart rate noted  Respiratory: Normal respiratory effort, no distress  Abdomen: Soft, gravid, nontender  Pelvic: Cervical exam performed  Dilation: 2 Effacement (%): 50 Station: -2  Extremities: Edema: None  Fetal Status: Fetal Heart Rate (bpm): 130 Fundal Height: 36 cm Movement: Present Presentation: Vertex  Chaperone:  Peggy Dones    No results found for this or any previous visit (from the past 24 hour(s)).  Assessment & Plan:    Pregnancy: G1P0 at [redacted]w[redacted]d 1. Encounter for supervision of normal first pregnancy in third trimester   2. [redacted] weeks gestation of pregnancy      Meds: No orders of the defined types were placed in this encounter.  Labs/procedures today: none  Plan:  Continue  routine obstetrical care  Next visit: prefers in person    Reviewed: Term labor symptoms and general obstetric precautions including but not limited to vaginal bleeding, contractions, leaking of fluid and fetal movement were reviewed in detail with the patient.  All questions were answered. Has home bp cuff.. Check bp weekly, let us know if >140/90.   Follow-up: No follow-ups on file.  Future Appointments  Date Time Provider Department Center  09/03/2022  8:30 AM Cresenzo-Dishmon, Scarlette Calico, CNM [facility]    No orders of the defined types were placed in this encounter.  Jacklyn Shell DNP, CNM 08/27/2022 9:00 AM

## 2022-08-27 ENCOUNTER — Encounter: Payer: Self-pay | Admitting: Advanced Practice Midwife

## 2022-08-27 ENCOUNTER — Ambulatory Visit (INDEPENDENT_AMBULATORY_CARE_PROVIDER_SITE_OTHER): Payer: BC Managed Care – PPO | Admitting: Advanced Practice Midwife

## 2022-08-27 VITALS — BP 113/75 | HR 83 | Wt 199.5 lb

## 2022-08-27 DIAGNOSIS — Z3A39 39 weeks gestation of pregnancy: Secondary | ICD-10-CM

## 2022-08-27 DIAGNOSIS — Z3403 Encounter for supervision of normal first pregnancy, third trimester: Secondary | ICD-10-CM

## 2022-08-27 NOTE — Progress Notes (Signed)
   LOW-RISK PREGNANCY VISIT Patient name: Becky Garcia MRN [id number]  Date of birth: [birth year] Chief Complaint:   Routine Prenatal Visit  History of Present Illness:   Becky Garcia is a 27 y.o. G1P0 female at [redacted]w[redacted]d with an Estimated Date of Delivery: 09/03/22 being seen today for ongoing management of a low-risk pregnancy.  Today she reports no complaints. Contractions: Irritability. Vag. Bleeding: None.  Movement: Present. denies leaking of fluid. Review of Systems:   Pertinent items are noted in HPI Denies abnormal vaginal discharge w/ itching/odor/irritation, headaches, visual changes, shortness of breath, chest pain, abdominal pain, severe nausea/vomiting, or problems with urination or bowel movements unless otherwise stated above. Pertinent History Reviewed:  Reviewed past medical,surgical, social, obstetrical and family history.  Reviewed problem list, medications and allergies. Physical Assessment:   Vitals:   08/27/22 0840  BP: 113/75  Pulse: 83  Weight: 199 lb 8 oz (90.5 kg)  Body mass index is 31.72 kg/m.        Physical Examination:   General appearance: Well appearing, and in no distress  Mental status: Alert, oriented to person, place, and time  Skin: Warm & dry  Cardiovascular: Normal heart rate noted  Respiratory: Normal respiratory effort, no distress  Abdomen: Soft, gravid, nontender  Pelvic: Cervical exam performed  Dilation: 3.5 Effacement (%): 60 Station: -2  Extremities: Edema: None  Fetal Status: Fetal Heart Rate (bpm): 128 Fundal Height: 37 cm Movement: Present Presentation: Vertex  Chaperone:   Sheena, RN     No results found for this or any previous visit (from the past 24 hour(s)).  Assessment & Plan:    Pregnancy: G1P0 at [redacted]w[redacted]d 1. Encounter for supervision of normal first pregnancy in third trimester   2. [redacted] weeks gestation of pregnancy      Meds: No orders of the defined types were placed in this encounter.  Labs/procedures today:  none  Plan:  Continue routine obstetrical care  Next visit: prefers in person    Reviewed: Term labor symptoms and general obstetric precautions including but not limited to vaginal bleeding, contractions, leaking of fluid and fetal movement were reviewed in detail with the patient.  All questions were answered. Has home bp cuff. . Check bp weekly, let us know if >140/90.   Follow-up: No follow-ups on file.  Future Appointments  Date Time Provider Department Center  09/03/2022  8:30 AM Cresenzo-Dishmon, Scarlette Calico, CNM [facility]    No orders of the defined types were placed in this encounter.  Jacklyn Shell DNP, CNM 08/27/2022 9:00 AM

## 2022-08-28 ENCOUNTER — Encounter (HOSPITAL_COMMUNITY): Payer: Self-pay | Admitting: Obstetrics & Gynecology

## 2022-08-28 ENCOUNTER — Other Ambulatory Visit: Payer: Self-pay

## 2022-08-28 ENCOUNTER — Inpatient Hospital Stay (HOSPITAL_COMMUNITY): Payer: BC Managed Care – PPO | Admitting: Anesthesiology

## 2022-08-28 ENCOUNTER — Inpatient Hospital Stay (HOSPITAL_COMMUNITY)
Admission: AD | Admit: 2022-08-28 | Discharge: 2022-08-29 | DRG: 806 | Disposition: A | Discharge status: Home / Self Care | Payer: BC Managed Care – PPO | Attending: Obstetrics & Gynecology | Admitting: Obstetrics & Gynecology

## 2022-08-28 DIAGNOSIS — O26893 Other specified pregnancy related conditions, third trimester: Secondary | ICD-10-CM | POA: Diagnosis present

## 2022-08-28 DIAGNOSIS — Z3A39 39 weeks gestation of pregnancy: Secondary | ICD-10-CM

## 2022-08-28 DIAGNOSIS — D62 Acute posthemorrhagic anemia: Secondary | ICD-10-CM | POA: Diagnosis not present

## 2022-08-28 DIAGNOSIS — O9081 Anemia of the puerperium: Secondary | ICD-10-CM | POA: Diagnosis not present

## 2022-08-28 DIAGNOSIS — O9912 Other diseases of the blood and blood-forming organs and certain disorders involving the immune mechanism complicating childbirth: Principal | ICD-10-CM | POA: Diagnosis present

## 2022-08-28 DIAGNOSIS — Z3400 Encounter for supervision of normal first pregnancy, unspecified trimester: Secondary | ICD-10-CM

## 2022-08-28 DIAGNOSIS — D696 Thrombocytopenia, unspecified: Secondary | ICD-10-CM | POA: Diagnosis present

## 2022-08-28 DIAGNOSIS — O99119 Other diseases of the blood and blood-forming organs and certain disorders involving the immune mechanism complicating pregnancy, unspecified trimester: Secondary | ICD-10-CM | POA: Diagnosis present

## 2022-08-28 DIAGNOSIS — F419 Anxiety disorder, unspecified: Secondary | ICD-10-CM | POA: Diagnosis present

## 2022-08-28 LAB — TYPE AND SCREEN
ABO/RH(D): A POS
Antibody Screen: NEGATIVE

## 2022-08-28 LAB — CBC
HCT: 35.2 % — ABNORMAL LOW (ref 36.0–46.0)
Hemoglobin: 11.8 g/dL — ABNORMAL LOW (ref 12.0–15.0)
MCH: 28.7 pg (ref 26.0–34.0)
MCHC: 33.5 g/dL (ref 30.0–36.0)
MCV: 85.6 fL (ref 80.0–100.0)
Platelets: 142 10*3/uL — ABNORMAL LOW (ref 150–400)
RBC: 4.11 MIL/uL (ref 3.87–5.11)
RDW: 13.6 % (ref 11.5–15.5)
WBC: 12.2 10*3/uL — ABNORMAL HIGH (ref 4.0–10.5)
nRBC: 0 % (ref 0.0–0.2)

## 2022-08-28 LAB — RPR: RPR Ser Ql: NONREACTIVE

## 2022-08-28 MED ORDER — SENNOSIDES-DOCUSATE SODIUM 8.6-50 MG PO TABS
2.0000 | ORAL_TABLET | ORAL | Status: DC
  Administered 2022-08-28: 2 via ORAL
  Filled 2022-08-28: qty 2

## 2022-08-28 MED ORDER — ACETAMINOPHEN 325 MG PO TABS: 650.0000 mg | ORAL_TABLET | ORAL | Status: DC | PRN

## 2022-08-28 MED ORDER — FENTANYL-BUPIVACAINE-NACL 0.5-0.125-0.9 MG/250ML-% EP SOLN
12.0000 mL/h | EPIDURAL | Status: DC | PRN
  Administered 2022-08-28: 12 mL/h via EPIDURAL
  Filled 2022-08-28: qty 250

## 2022-08-28 MED ORDER — ONDANSETRON HCL 4 MG/2ML IJ SOLN: 4.0000 mg | INTRAMUSCULAR | Status: DC | PRN

## 2022-08-28 MED ORDER — PRENATAL MULTIVITAMIN CH
1.0000 | ORAL_TABLET | Freq: Every day | ORAL | Status: DC
  Administered 2022-08-29: 1 via ORAL
  Filled 2022-08-28: qty 1

## 2022-08-28 MED ORDER — PHENYLEPHRINE 80 MCG/ML (10ML) SYRINGE FOR IV PUSH (FOR BLOOD PRESSURE SUPPORT): 80.0000 ug | PREFILLED_SYRINGE | INTRAVENOUS | Status: DC | PRN

## 2022-08-28 MED ORDER — OXYCODONE-ACETAMINOPHEN 5-325 MG PO TABS: 2.0000 | ORAL_TABLET | ORAL | Status: DC | PRN

## 2022-08-28 MED ORDER — BENZOCAINE-MENTHOL 20-0.5 % EX AERO
1.0000 | INHALATION_SPRAY | CUTANEOUS | Status: DC | PRN
  Filled 2022-08-28: qty 56

## 2022-08-28 MED ORDER — OXYTOCIN-SODIUM CHLORIDE 30-0.9 UT/500ML-% IV SOLN
2.5000 [IU]/h | INTRAVENOUS | Status: DC
  Administered 2022-08-28: 2.5 [IU]/h via INTRAVENOUS
  Filled 2022-08-28: qty 500

## 2022-08-28 MED ORDER — FENTANYL CITRATE (PF) 100 MCG/2ML IJ SOLN
INTRAMUSCULAR | Status: AC
  Administered 2022-08-28: 100 ug via INTRAVENOUS
  Filled 2022-08-28: qty 2

## 2022-08-28 MED ORDER — EPHEDRINE 5 MG/ML INJ: 10.0000 mg | INTRAVENOUS | Status: DC | PRN

## 2022-08-28 MED ORDER — SODIUM CHLORIDE 0.9% FLUSH
3.0000 mL | Freq: Two times a day (BID) | INTRAVENOUS | Status: DC
  Administered 2022-08-28 (×2): 3 mL via INTRAVENOUS

## 2022-08-28 MED ORDER — ONDANSETRON HCL 4 MG/2ML IJ SOLN: 4.0000 mg | Freq: Four times a day (QID) | INTRAMUSCULAR | Status: DC | PRN

## 2022-08-28 MED ORDER — LACTATED RINGERS IV SOLN
500.0000 mL | Freq: Once | INTRAVENOUS | Status: AC
  Administered 2022-08-28: 500 mL via INTRAVENOUS

## 2022-08-28 MED ORDER — FENTANYL CITRATE (PF) 100 MCG/2ML IJ SOLN
50.0000 ug | INTRAMUSCULAR | Status: DC | PRN
  Administered 2022-08-28: 100 ug via INTRAVENOUS
  Filled 2022-08-28: qty 2

## 2022-08-28 MED ORDER — ZOLPIDEM TARTRATE 5 MG PO TABS: 5.0000 mg | ORAL_TABLET | Freq: Every evening | ORAL | Status: DC | PRN

## 2022-08-28 MED ORDER — DIPHENHYDRAMINE HCL 50 MG/ML IJ SOLN: 12.5000 mg | INTRAMUSCULAR | Status: DC | PRN

## 2022-08-28 MED ORDER — IBUPROFEN 600 MG PO TABS
600.0000 mg | ORAL_TABLET | Freq: Four times a day (QID) | ORAL | Status: DC
  Administered 2022-08-28 – 2022-08-29 (×4): 600 mg via ORAL
  Filled 2022-08-28 (×4): qty 1

## 2022-08-28 MED ORDER — LIDOCAINE-EPINEPHRINE (PF) 2 %-1:200000 IJ SOLN
INTRAMUSCULAR | Status: DC | PRN
  Administered 2022-08-28: 5 mL via EPIDURAL

## 2022-08-28 MED ORDER — SOD CITRATE-CITRIC ACID 500-334 MG/5ML PO SOLN: 30.0000 mL | ORAL | Status: DC | PRN

## 2022-08-28 MED ORDER — DIPHENHYDRAMINE HCL 25 MG PO CAPS: 25.0000 mg | ORAL_CAPSULE | Freq: Four times a day (QID) | ORAL | Status: DC | PRN

## 2022-08-28 MED ORDER — OXYTOCIN BOLUS FROM INFUSION
333.0000 mL | Freq: Once | INTRAVENOUS | Status: AC
  Administered 2022-08-28: 333 mL via INTRAVENOUS

## 2022-08-28 MED ORDER — SIMETHICONE 80 MG PO CHEW: 80.0000 mg | CHEWABLE_TABLET | ORAL | Status: DC | PRN

## 2022-08-28 MED ORDER — OXYCODONE-ACETAMINOPHEN 5-325 MG PO TABS: 1.0000 | ORAL_TABLET | ORAL | Status: DC | PRN

## 2022-08-28 MED ORDER — SODIUM CHLORIDE 0.9% FLUSH: 3.0000 mL | INTRAVENOUS | Status: DC | PRN

## 2022-08-28 MED ORDER — LIDOCAINE HCL (PF) 1 % IJ SOLN
30.0000 mL | INTRAMUSCULAR | Status: AC | PRN
  Administered 2022-08-28: 30 mL via SUBCUTANEOUS
  Filled 2022-08-28: qty 30

## 2022-08-28 MED ORDER — BUPIVACAINE HCL (PF) 0.25 % IJ SOLN
INTRAMUSCULAR | Status: DC | PRN
  Administered 2022-08-28: 10 mL via EPIDURAL

## 2022-08-28 MED ORDER — LACTATED RINGERS IV SOLN: 500.0000 mL | INTRAVENOUS | Status: DC | PRN

## 2022-08-28 MED ORDER — LACTATED RINGERS IV SOLN: INTRAVENOUS | Status: DC

## 2022-08-28 MED ORDER — COCONUT OIL OIL: 1.0000 | TOPICAL_OIL | Status: DC | PRN

## 2022-08-28 MED ORDER — DIBUCAINE (PERIANAL) 1 % EX OINT: 1.0000 | TOPICAL_OINTMENT | CUTANEOUS | Status: DC | PRN

## 2022-08-28 MED ORDER — ONDANSETRON HCL 4 MG PO TABS: 4.0000 mg | ORAL_TABLET | ORAL | Status: DC | PRN

## 2022-08-28 MED ORDER — SODIUM CHLORIDE 0.9 % IV SOLN: 250.0000 mL | INTRAVENOUS | Status: DC | PRN

## 2022-08-28 MED ORDER — WITCH HAZEL-GLYCERIN EX PADS: 1.0000 | MEDICATED_PAD | CUTANEOUS | Status: DC | PRN

## 2022-08-28 NOTE — Anesthesia Procedure Notes (Addendum)
Epidural Patient location during procedure: OB Start time: 08/28/2022 8:15 AM End time: 08/28/2022 8:25 AM  Staffing Anesthesiologist: Elmer Picker, MD Performed: anesthesiologist   Preanesthetic Checklist Completed: patient identified, IV checked, risks and benefits discussed, monitors and equipment checked, pre-op evaluation and timeout performed  Epidural Patient position: sitting Prep: DuraPrep and site prepped and draped Patient monitoring: continuous pulse ox, blood pressure, heart rate and cardiac monitor Approach: midline Location: L3-L4 Injection technique: LOR air  Needle:  Needle type: Tuohy  Needle gauge: 17 G Needle length: 9 cm Needle insertion depth: 5 cm Catheter type: closed end flexible Catheter size: 19 Gauge Catheter at skin depth: 10 cm Test dose: negative  Assessment Sensory level: T8 Events: blood not aspirated, no cerebrospinal fluid, injection not painful, no injection resistance, no paresthesia and negative IV test  Additional Notes Patient identified. Risks/Benefits/Options discussed with patient including but not limited to bleeding, infection, nerve damage, paralysis, failed block, incomplete pain control, headache, blood pressure changes, nausea, vomiting, reactions to medication both or allergic, itching and postpartum back pain. Confirmed with bedside nurse the patient's most recent platelet count. Confirmed with patient that they are not currently taking any anticoagulation, have any bleeding history or any family history of bleeding disorders. Patient expressed understanding and wished to proceed. All questions were answered. Sterile technique was used throughout the entire procedure. Please see nursing notes for vital signs. Test dose was given through epidural catheter and negative prior to continuing to dose epidural or start infusion. Warning signs of high block given to the patient including shortness of breath, tingling/numbness in hands,  complete motor block, or any concerning symptoms with instructions to call for help. Patient was given instructions on fall risk and not to get out of bed. All questions and concerns addressed with instructions to call with any issues or inadequate analgesia.  Reason for block:procedure for pain

## 2022-08-28 NOTE — Discharge Summary (Signed)
Postpartum Discharge Summary  Date of Service updated***     Patient Name: Becky Garcia DOB: December 09, 1995 MRN: [id number]  Date of admission: 08/28/2022 Delivery date:08/28/2022 Delivering provider: Myrtie Hawk Date of discharge: 08/28/2022  Admitting diagnosis: Indication for care in labor or delivery [O75.9] Intrauterine pregnancy: [redacted]w[redacted]d     Secondary diagnosis:  Principal Problem:   Vaginal delivery Active Problems:   Supervision of normal first pregnancy   Thrombocytopenia during pregnancy [facility])  Additional problems: ***    Discharge diagnosis: Term Pregnancy Delivered                                              Post partum procedures:{Postpartum procedures:23558} Augmentation: AROM Complications: None  Hospital course: Onset of Labor With Vaginal Delivery      27 y.o. yo G1P0 at [redacted]w[redacted]d was admitted in Active Labor on 08/28/2022. Labor course was complicated by none  Membrane Rupture Time/Date: 11:16 AM,08/28/2022  Delivery Method:Vaginal, Spontaneous Episiotomy: None Lacerations:  Periurethral;2nd degree Patient had a postpartum course complicated by ***.  She is ambulating, tolerating a regular diet, passing flatus, and urinating well. Patient is discharged home in stable condition on 08/28/22.  Newborn Data: Birth date:08/28/2022 Birth time:11:52 AM Gender:Female Living status:Living Apgars:9 ,9  Weight:   Magnesium Sulfate received: {Mag received:30440022} BMZ received: No Rhophylac:N/A MMR:N/A T-DaP:Given prenatally Flu: Yes Transfusion:{Transfusion received:30440034}  Physical exam  Vitals:   08/28/22 1045 08/28/22 1130 08/28/22 1231 08/28/22 1247  BP: 120/61 (!) 146/90 119/68 130/64  Pulse: 74 89 84 84  Resp: 16 20 16 16   Temp:      TempSrc:      SpO2: 98%     Weight:      Height:       General: {Exam; general:21111117} Lochia: {Desc; appropriate/inappropriate:30686::"appropriate"} Uterine Fundus: {Desc; firm/soft:30687} Incision:  {Exam; incision:21111123} DVT Evaluation: {Exam; dvt:2111122} Labs: Lab Results  Component Value Date   WBC 12.2 (H) 08/28/2022   HGB 11.8 (L) 08/28/2022   HCT 35.2 (L) 08/28/2022   MCV 85.6 08/28/2022   PLT 142 (L) 08/28/2022       No data to display         Edinburgh Score:     No data to display           After visit meds:  Allergies as of 08/28/2022   No Known Allergies   Med Rec must be completed prior to using this [facility]***        Discharge home in stable condition Infant Feeding: {Baby feeding:23562} Infant Disposition:{CHL IP OB HOME WITH WUJWJX:91478} Discharge instruction: per After Visit Summary and Postpartum booklet. Activity: Advance as tolerated. Pelvic rest for 6 weeks.  Diet: {OB GNFA:21308657} Future Appointments: Future Appointments  Date Time Provider Department Center  09/03/2022  8:30 AM Cresenzo-Dishmon, Scarlette Calico, CNM [facility]   Follow up Visit: Message sent to [facility] 7/26  Please schedule this patient for a In person postpartum visit in 6 weeks with the following provider: Any provider. Additional Postpartum F/U:Postpartum Depression checkup  Low risk pregnancy complicated by:  none Delivery mode:  Vaginal, Spontaneous Anticipated Birth Control:  POPs   08/28/2022 Myrtie Hawk, DO

## 2022-08-28 NOTE — Plan of Care (Signed)

## 2022-08-28 NOTE — H&P (Signed)
Becky Garcia is a 27 y.o. female G1P0 with IUP at [redacted]w[redacted]d by 7 week [place] presenting for contractions that started around 0100, now q 3-4 minutes. She reports positive fetal movement. She denies leakage of fluid or vaginal bleeding.  Prenatal History/Complications: PNC at [facility]   Pregnancy complications:  - Past Medical History: Past Medical History:  Diagnosis Date   Anxiety    Hyperlipemia     Past Surgical History: No past surgical history on file.  Obstetrical History: OB History     Gravida  1   Para      Term      Preterm      AB      Living         SAB      IAB      Ectopic      Multiple      Live Births               Social History: Social History   Socioeconomic History   Marital status: Married    Spouse name: Not on file   Number of children: Not on file   Years of education: Not on file   Highest education level: Not on file  Occupational History   Not on file  Tobacco Use   Smoking status: Never   Smokeless tobacco: Never  Vaping Use   Vaping status: Never Used  Substance and Sexual Activity   Alcohol use: Never   Drug use: Never   Sexual activity: Yes    Birth control/protection: None  Other Topics Concern   Not on file  Social History Narrative   ** Merged History Encounter **       Social Determinants of Health   Financial Resource Strain: Low Risk  (01/21/2022)   Overall Financial Resource Strain (CARDIA)    Difficulty of Paying Living Expenses: Not hard at all  Food Insecurity: No Food Insecurity (01/21/2022)   Hunger Vital Sign    Worried About Running Out of Food in the Last Year: Never true    Ran Out of Food in the Last Year: Never true  Transportation Needs: No Transportation Needs (01/21/2022)   PRAPARE - Administrator, Civil Service (Medical): No    Lack of Transportation (Non-Medical): No  Physical Activity: Insufficiently Active (01/21/2022)   Exercise Vital Sign    Days of Exercise  per Week: 2 days    Minutes of Exercise per Session: 10 min  Stress: No Stress Concern Present (01/21/2022)   [employer] of Occupational Health - Occupational Stress Questionnaire    Feeling of Stress : Not at all  Social Connections: Socially Integrated (01/21/2022)   Social Connection and Isolation Panel [NHANES]    Frequency of Communication with Friends and Family: More than three times a week    Frequency of Social Gatherings with Friends and Family: Not on file    Attends Religious Services: More than 4 times per year    Active Member of [employer] or Organizations: Yes    Attends Engineer, structural: More than 4 times per year    Marital Status: Married    Family History: Family History  Problem Relation Age of Onset   Hyperlipidemia Mother    Arthritis Mother    Heart attack Father    Heart disease Father    Bipolar disorder Sister    Kidney disease Maternal Uncle    Diabetes Maternal Uncle    Kidney  Stones Maternal Uncle    Diabetes Maternal Grandmother    Diabetes Maternal Grandfather    Diabetes Paternal Grandmother     Allergies: No Known Allergies  Medications Prior to Admission  Medication Sig Dispense Refill Last Dose   escitalopram (LEXAPRO) 10 MG tablet TAKE 1 TABLET(10 MG) BY MOUTH DAILY 90 tablet 3    Prenat-Fe Carbonyl-FA-Omega 3 (ONE-A-DAY WOMENS PRENATAL 1 PO)        Review of Systems   Constitutional: Negative for fever and chills Eyes: Negative for visual disturbances Respiratory: Negative for shortness of breath, dyspnea Cardiovascular: Negative for chest pain or palpitations  Gastrointestinal: Negative for vomiting, diarrhea and constipation.  POSITIVE for abdominal pain (contractions) Genitourinary: Negative for dysuria and urgency Musculoskeletal: Negative for back pain, joint pain, myalgias  Neurological: Negative for dizziness and headaches  Blood pressure 131/80, pulse 82, temperature 98.4 F (36.9 C), temperature source  Oral, resp. rate 18, last menstrual period 11/27/2021, SpO2 99%. General appearance: alert, cooperative, and no distress Lungs: normal respiratory effort Heart: regular rate and rhythm Abdomen: soft, non-tender; bowel sounds normal Extremities: Homans sign is negative, no sign of DVT DTR's 2+ Presentation: cephalic Fetal monitoring  Baseline: 120 bpm, Variability: Good {> 6 bpm), Accelerations: Reactive, and Decelerations: Absent Uterine activity  2-4 Dilation: 7 Effacement (%): 90 Station: -2 Exam by:: Erle Crocker, RN   Prenatal labs: ABO, Rh: A/Positive/-- (01/29 1510) Antibody: Negative (05/06 0829) Rubella: 1.12 (01/29 1510) RPR: Non Reactive (05/06 0829)  HBsAg: Negative (01/29 1510)  HIV: Non Reactive (05/06 0829)  GBS: Negative/-- (07/11 1215)    FAMILY TREE  RESULTS  Language English Pap 07/25/19 neg  Initiated care at 13wks GC/CT Initial:   -/-         36wks:-/-  Dating by LMP c/w 7wk [place]    Support person [employer] NT/IT: neg    AFP:      Panorama: declined  BP cuff Has bp cuff Carrier Screen declined    [place]/Hgb Elec   Rhogam N/a    TDaP vaccine 08/27/22  Blood Type A/Positive/-- (01/29 1510)  Flu vaccine Oct 2023 @ work Antibody Negative (05/06 0829)  Covid vaccine  HBsAg Negative (01/29 1510)    RPR Non Reactive (05/06 0829)  Anatomy US Normal female 'Mauricio Po' Rubella  1.12 (01/29 1510)  Feeding Plan breast HIV Non Reactive (05/06 0829)  Contraception POPs Hep C Neg  Circumcision yes    Pediatrician [facility] A1C/GTT Early:      26-28wks: normal  Prenatal Classes discussed      GBS Negative/-- (07/11 1215)   [ ]  PCN allergy  BTL Consent     VBAC Consent N/a PHQ9 & GAD7  [ ] New OB  [ ] 28wks   [ ] 36wks  Waterbirth [ ] Class [ ]  36wkCNM visit/consent HGB Fraction Cascade 03/02/22 negative    Prenatal Transfer Tool  Maternal Diabetes: No Genetic Screening: Normal Maternal Ultrasounds/Referrals: Normal Fetal Ultrasounds or other Referrals:   None Maternal Substance Abuse:  No Significant Maternal Medications:  None Significant Maternal Lab Results: Group B Strep negative  No results found for this or any previous visit (from the past 24 hour(s)).  Assessment: CHELSEY RAYCRAFT is a 27 y.o. G1P0 with an IUP at [redacted]w[redacted]d presenting for active labor  Plan: #Labor: expectant management #Pain: wants epidural #FWB Cat 1 #ID: GBS: neg  #MOF:  breast #MOC: POPs #Circ: yes   Jacklyn Shell 08/28/2022, 7:24 AM

## 2022-08-28 NOTE — MAU Note (Signed)
.  Becky Garcia is a 27 y.o. at [redacted]w[redacted]d here in MAU reporting: CTX's that began around 0100 this morning that are currently 3-4 minutes apart. Reports bloody show. Denies LOF. +FM.   SVE yesterday in office was 3.560-2. GBS-.  Onset of complaint: 0100 Pain score: 8/10 lower abdomen  FHT: 140 initial external Lab orders placed from triage:  MAU Labor Eval

## 2022-08-28 NOTE — Lactation Note (Signed)
This note was copied from a baby's chart. Lactation Consultation Note  Patient Name: Becky Garcia [id number] Date: 08/28/2022 Age:28 hours Reason for consult: Initial assessment;1st time breastfeeding;Primapara;Term Birth Parent requested latch assistance, Birth Parent did reverse pressure softening prior to latching infant at the breast, Birth Parent latched infant on her right breast using the football hold position, infant sustained latch briefly breastfeed for 5 minutes then became sleep. Birth Parent had expressed 5 mls of colostrum from using hand pump earlier that was less than 4 hours , that was given to infant by spoon during this feeding. Birth Parent will continue to work towards latching infant at the breast, will do reverse pressure soften or briefly use hand pump to help evert nipple out more prior to latching infant at the breast. Birth Parent will continue to breastfeed infant by cues, on demand, 8 to 12+ times within 24 hours, skin to skin. Birth Parent will ask RN/LC for further latch assistance if needed. Birth Parent know if infant doesn't latch she can use hand pump or hand express and give infant back EBM by spoon. LC discussed infant's input and output, Per birth Parent, infant had 2 stools since birth. LC discussed the importance of maternal rest, diet and hydration. Birth Parent was made aware of O/P services, breastfeeding support groups, community resources, and our phone # for post-discharge questions.   Maternal Data Has patient been taught Hand Expression?: Yes Does the patient have breastfeeding experience prior to this delivery?: No  Feeding Mother's Current Feeding Choice: Breast Milk  LATCH Score Latch: Grasps breast easily, tongue down, lips flanged, rhythmical sucking.  Audible Swallowing: A few with stimulation  Type of Nipple: Everted at rest and after stimulation (Short shafted will pre-pump prior to latching infant at the breast.)  Comfort (Breast/Nipple):  Soft / non-tender  Hold (Positioning): Assistance needed to correctly position infant at breast and maintain latch.  LATCH Score: 8   Lactation Tools Discussed/Used Tools: Flanges;Pump Flange Size: 24 Breast pump type: Manual Pump Education: Setup, frequency, and cleaning;Milk Storage Reason for Pumping: Giveh hnad pump by RN to pre-pump breast prior to latching infant to help evert nipple shaft out more ( short shafted) Pumping frequency: Pre-pump prior to latching infant at the breast. Pumped volume: 5 mL (Birth Parent had expressed 5 mls of colostrum when using hand pump prior to [facility] entering the room.)  Interventions Interventions: Breast feeding basics reviewed;Adjust position;Support pillows;Assisted with latch;Education;Position options;Skin to skin;Breast compression;LC Services brochure;Breast massage;Pre-pump if needed;Reverse pressure  Discharge Pump: DEBP;Personal  Consult Status Consult Status: Follow-up Date: 08/29/22 Follow-up type: In-patient    Frederico Hamman 08/28/2022, 5:23 PM

## 2022-08-28 NOTE — Anesthesia Preprocedure Evaluation (Signed)
Anesthesia Evaluation  Patient identified by MRN, date of birth, ID band Patient awake    Reviewed: Allergy & Precautions, NPO status , Patient's Chart, lab work & pertinent test results  Airway Mallampati: II  TM Distance: >3 FB Neck ROM: Full    Dental no notable dental hx.    Pulmonary neg pulmonary ROS   Pulmonary exam normal breath sounds clear to auscultation       Cardiovascular negative cardio ROS Normal cardiovascular exam Rhythm:Regular Rate:Normal     Neuro/Psych  PSYCHIATRIC DISORDERS Anxiety     negative neurological ROS     GI/Hepatic negative GI ROS, Neg liver ROS,,,  Endo/Other  negative endocrine ROS    Renal/GU negative Renal ROS  negative genitourinary   Musculoskeletal negative musculoskeletal ROS (+)    Abdominal   Peds  Hematology negative hematology ROS (+)   Anesthesia Other Findings Presents in labor   Reproductive/Obstetrics (+) Pregnancy                             Anesthesia Physical Anesthesia Plan  ASA: 2  Anesthesia Plan: Epidural   Post-op Pain Management:    Induction:   PONV Risk Score and Plan: Treatment may vary due to age or medical condition  Airway Management Planned: Natural Airway  Additional Equipment:   Intra-op Plan:   Post-operative Plan:   Informed Consent: I have reviewed the patients History and Physical, chart, labs and discussed the procedure including the risks, benefits and alternatives for the proposed anesthesia with the patient or authorized representative who has indicated his/her understanding and acceptance.       Plan Discussed with: Anesthesiologist  Anesthesia Plan Comments: (Patient identified. Risks, benefits, options discussed with patient including but not limited to bleeding, infection, nerve damage, paralysis, failed block, incomplete pain control, headache, blood pressure changes, nausea, vomiting,  reactions to medication, itching, and post partum back pain. Confirmed with bedside nurse the patient's most recent platelet count. Confirmed with the patient that they are not taking any anticoagulation, have any bleeding history or any family history of bleeding disorders. Patient expressed understanding and wishes to proceed. All questions were answered. )       Anesthesia Quick Evaluation

## 2022-08-29 ENCOUNTER — Ambulatory Visit (HOSPITAL_COMMUNITY): Payer: Self-pay

## 2022-08-29 MED ORDER — IBUPROFEN 600 MG PO TABS: 600.0000 mg | ORAL_TABLET | Freq: Four times a day (QID) | ORAL | 0 refills | Status: DC

## 2022-08-29 MED ORDER — FERROUS SULFATE 325 (65 FE) MG PO TABS: 325.0000 mg | ORAL_TABLET | Freq: Every day | ORAL | 0 refills | Status: DC

## 2022-08-29 MED ORDER — NORETHINDRONE 0.35 MG PO TABS: 1.0000 | ORAL_TABLET | Freq: Every day | ORAL | 0 refills | Status: DC

## 2022-08-29 MED ORDER — AMMONIA AROMATIC IN INHA
RESPIRATORY_TRACT | Status: AC
  Filled 2022-08-29: qty 10

## 2022-08-29 MED ORDER — ACETAMINOPHEN 325 MG PO TABS: 650.0000 mg | ORAL_TABLET | ORAL | 0 refills | Status: DC | PRN

## 2022-08-29 MED ORDER — FERROUS SULFATE 325 (65 FE) MG PO TABS
325.0000 mg | ORAL_TABLET | Freq: Every day | ORAL | Status: DC
  Administered 2022-08-29: 325 mg via ORAL
  Filled 2022-08-29: qty 1

## 2022-08-29 NOTE — Discharge Instructions (Signed)
WHAT TO LOOK OUT FOR: Fever of 100.4 or above Mastitis: feels like flu and breasts hurt Infection: increased pain, swelling or redness Blood clots golf ball size or larger Postpartum depression   Congratulations on your newest addition!WHAT TO LOOK OUT FOR: Fever of 100.4 or above Mastitis: feels like flu and breasts hurt Infection: increased pain, swelling or redness Blood clots golf ball size or larger Postpartum depression   Congratulations on your newest addition! 

## 2022-08-29 NOTE — Progress Notes (Signed)
POSTPARTUM PROGRESS NOTE  Post Partum Day 1  Subjective:  Becky Garcia is a 27 y.o. G1P1001 s/p VD at [redacted]w[redacted]d.  She reports she is doing well. No acute events overnight. She denies any problems with ambulating, voiding or po intake. Denies nausea or vomiting.  Pain is well controlled.  Lochia is appropriate.  Objective: Blood pressure 114/64, pulse 84, temperature 97.9 F (36.6 C), temperature source Oral, resp. rate 18, height 5\' 4"  (1.626 m), weight 90.5 kg, last menstrual period 11/27/2021, SpO2 98%, unknown if currently breastfeeding.  Physical Exam:  General: alert, cooperative and no distress Chest: no respiratory distress Heart:regular rate, distal pulses intact Abdomen: soft, nontender,  Uterine Fundus: firm, appropriately tender DVT Evaluation: No calf swelling or tenderness Extremities: No LE edema Skin: warm, dry  Recent Labs    08/28/22 0725 08/29/22 0535  HGB 11.8* 9.0*  HCT 35.2* 27.4*    Assessment/Plan: Becky Garcia is a 27 y.o. G1P1001 s/p VD at [redacted]w[redacted]d   PPD#1 - Doing well  Routine postpartum care Asymptomatic anemia-oral iron Contraception: POPs Feeding: Breast Dispo: Plan for discharge 7/27 .   LOS: 1 day   Lavonda Jumbo, DO OB Fellow, Faculty Practice [facility] 08/29/2022, 7:57 AM

## 2022-08-29 NOTE — Plan of Care (Signed)
Problem: Education: Goal: Knowledge of Childbirth will improve 08/29/2022 0855 by Becky Hazel, LPN Outcome: Adequate for Discharge 08/29/2022 0725 by Becky Hazel, LPN Outcome: Progressing Goal: Ability to make informed decisions regarding treatment and plan of care will improve 08/29/2022 0855 by Becky Hazel, LPN Outcome: Adequate for Discharge 08/29/2022 0725 by Becky Hazel, LPN Outcome: Progressing Goal: Ability to state and carry out methods to decrease the pain will improve 08/29/2022 0855 by Becky Hazel, LPN Outcome: Adequate for Discharge 08/29/2022 0725 by Becky Hazel, LPN Outcome: Progressing Goal: Individualized Educational Video(s) 08/29/2022 0855 by Becky Hazel, LPN Outcome: Adequate for Discharge 08/29/2022 0725 by Becky Hazel, LPN Outcome: Progressing   Problem: Coping: Goal: Ability to verbalize concerns and feelings about labor and delivery will improve 08/29/2022 0855 by Becky Hazel, LPN Outcome: Adequate for Discharge 08/29/2022 0725 by Becky Hazel, LPN Outcome: Progressing   Problem: Life Cycle: Goal: Ability to make normal progression through stages of labor will improve 08/29/2022 0855 by Becky Hazel, LPN Outcome: Adequate for Discharge 08/29/2022 0725 by Becky Hazel, LPN Outcome: Progressing Goal: Ability to effectively push during vaginal delivery will improve 08/29/2022 0855 by Becky Hazel, LPN Outcome: Adequate for Discharge 08/29/2022 0725 by Becky Hazel, LPN Outcome: Progressing   Problem: Role Relationship: Goal: Will demonstrate positive interactions with the child 08/29/2022 0855 by Becky Hazel, LPN Outcome: Adequate for Discharge 08/29/2022 0725 by Becky Hazel, LPN Outcome: Progressing   Problem: Safety: Goal: Risk of complications during labor and delivery will decrease 08/29/2022 0855 by Becky Hazel, LPN Outcome: Adequate for Discharge 08/29/2022 0725 by Becky Hazel, LPN Outcome: Progressing    Problem: Pain Management: Goal: Relief or control of pain from uterine contractions will improve 08/29/2022 0855 by Becky Hazel, LPN Outcome: Adequate for Discharge 08/29/2022 0725 by Becky Hazel, LPN Outcome: Progressing   Problem: Education: Goal: Knowledge of General Education information will improve Description: Including pain rating scale, medication(s)/side effects and non-pharmacologic comfort measures 08/29/2022 0855 by Becky Hazel, LPN Outcome: Adequate for Discharge 08/29/2022 0725 by Becky Hazel, LPN Outcome: Progressing   Problem: Health Behavior/Discharge Planning: Goal: Ability to manage health-related needs will improve 08/29/2022 0855 by Becky Hazel, LPN Outcome: Adequate for Discharge 08/29/2022 0725 by Becky Hazel, LPN Outcome: Progressing   Problem: Clinical Measurements: Goal: Ability to maintain clinical measurements within normal limits will improve 08/29/2022 0855 by Becky Hazel, LPN Outcome: Adequate for Discharge 08/29/2022 0725 by Becky Hazel, LPN Outcome: Progressing Goal: Will remain free from infection 08/29/2022 0855 by Becky Hazel, LPN Outcome: Adequate for Discharge 08/29/2022 0725 by Becky Hazel, LPN Outcome: Progressing Goal: Diagnostic test results will improve 08/29/2022 0855 by Becky Hazel, LPN Outcome: Adequate for Discharge 08/29/2022 0725 by Becky Hazel, LPN Outcome: Progressing Goal: Respiratory complications will improve 08/29/2022 0855 by Becky Hazel, LPN Outcome: Adequate for Discharge 08/29/2022 0725 by Becky Hazel, LPN Outcome: Progressing Goal: Cardiovascular complication will be avoided 08/29/2022 0855 by Becky Hazel, LPN Outcome: Adequate for Discharge 08/29/2022 0725 by Becky Hazel, LPN Outcome: Progressing   Problem: Activity: Goal: Risk for activity intolerance will decrease 08/29/2022 0855 by Becky Hazel, LPN Outcome: Adequate for Discharge 08/29/2022 0725 by Becky Hazel,  LPN Outcome: Progressing   Problem: Nutrition: Goal: Adequate nutrition will be maintained 08/29/2022 0855 by Becky Hazel, LPN Outcome: Adequate for Discharge 08/29/2022 0725 by  Adam Phenix A, LPN Outcome: Progressing   Problem: Coping: Goal: Level of anxiety will decrease 08/29/2022 0855 by Becky Hazel, LPN Outcome: Adequate for Discharge 08/29/2022 0725 by Becky Hazel, LPN Outcome: Progressing   Problem: Elimination: Goal: Will not experience complications related to bowel motility 08/29/2022 0855 by Becky Hazel, LPN Outcome: Adequate for Discharge 08/29/2022 0725 by Becky Hazel, LPN Outcome: Progressing Goal: Will not experience complications related to urinary retention 08/29/2022 0855 by Becky Hazel, LPN Outcome: Adequate for Discharge 08/29/2022 0725 by Becky Hazel, LPN Outcome: Progressing   Problem: Pain Managment: Goal: General experience of comfort will improve 08/29/2022 0855 by Becky Hazel, LPN Outcome: Adequate for Discharge 08/29/2022 0725 by Becky Hazel, LPN Outcome: Progressing   Problem: Safety: Goal: Ability to remain free from injury will improve 08/29/2022 0855 by Becky Hazel, LPN Outcome: Adequate for Discharge 08/29/2022 0725 by Becky Hazel, LPN Outcome: Progressing   Problem: Skin Integrity: Goal: Risk for impaired skin integrity will decrease 08/29/2022 0855 by Becky Hazel, LPN Outcome: Adequate for Discharge 08/29/2022 0725 by Becky Hazel, LPN Outcome: Progressing   Problem: Education: Goal: Knowledge of condition will improve 08/29/2022 0855 by Becky Hazel, LPN Outcome: Adequate for Discharge 08/29/2022 0725 by Becky Hazel, LPN Outcome: Progressing Goal: Individualized Educational Video(s) 08/29/2022 0855 by Becky Hazel, LPN Outcome: Adequate for Discharge 08/29/2022 0725 by Becky Hazel, LPN Outcome: Progressing Goal: Individualized Newborn Educational Video(s) 08/29/2022 0855 by Becky Hazel,  LPN Outcome: Adequate for Discharge 08/29/2022 0725 by Becky Hazel, LPN Outcome: Progressing   Problem: Activity: Goal: Will verbalize the importance of balancing activity with adequate rest periods 08/29/2022 0855 by Becky Hazel, LPN Outcome: Adequate for Discharge 08/29/2022 0725 by Becky Hazel, LPN Outcome: Progressing Goal: Ability to tolerate increased activity will improve 08/29/2022 0855 by Becky Hazel, LPN Outcome: Adequate for Discharge 08/29/2022 0725 by Becky Hazel, LPN Outcome: Progressing   Problem: Coping: Goal: Ability to identify and utilize available resources and services will improve 08/29/2022 0855 by Becky Hazel, LPN Outcome: Adequate for Discharge 08/29/2022 0725 by Becky Hazel, LPN Outcome: Progressing   Problem: Life Cycle: Goal: Chance of risk for complications during the postpartum period will decrease 08/29/2022 0855 by Becky Hazel, LPN Outcome: Adequate for Discharge 08/29/2022 0725 by Becky Hazel, LPN Outcome: Progressing   Problem: Role Relationship: Goal: Ability to demonstrate positive interaction with newborn will improve 08/29/2022 0855 by Becky Hazel, LPN Outcome: Adequate for Discharge 08/29/2022 0725 by Becky Hazel, LPN Outcome: Progressing   Problem: Skin Integrity: Goal: Demonstration of wound healing without infection will improve 08/29/2022 0855 by Becky Hazel, LPN Outcome: Adequate for Discharge 08/29/2022 0725 by Becky Hazel, LPN Outcome: Progressing

## 2022-08-29 NOTE — Anesthesia Postprocedure Evaluation (Signed)
Anesthesia Post Note  Patient: Becky Garcia  Procedure(s) Performed: AN AD HOC LABOR EPIDURAL     Patient location during evaluation: Mother Baby Anesthesia Type: Epidural Level of consciousness: awake Pain management: satisfactory to patient Vital Signs Assessment: post-procedure vital signs reviewed and stable Respiratory status: spontaneous breathing Cardiovascular status: stable Anesthetic complications: no  No notable events documented.  Last Vitals:  Vitals:   08/28/22 2339 08/29/22 0400  BP: (!) 115/58 114/64  Pulse: 82 84  Resp: 18 18  Temp: 37.1 C 36.6 C  SpO2: 98% 98%    Last Pain:  Vitals:   08/29/22 0853  TempSrc:   PainSc: 0-No pain   Pain Goal:                Epidural/Spinal Function Cutaneous sensation: Normal sensation (08/29/22 0853), Patient able to flex knees: Yes (08/29/22 0853), Patient able to lift hips off bed: Yes (08/29/22 0853), Back pain beyond tenderness at insertion site: No (08/29/22 0853), Progressively worsening motor and/or sensory loss: No (08/29/22 0853), Bowel and/or bladder incontinence post epidural: No (08/29/22 3086)  Cephus Shelling

## 2022-08-29 NOTE — Plan of Care (Signed)

## 2022-08-29 NOTE — Lactation Note (Addendum)
This note was copied from a baby's chart. Lactation Consultation Note  Patient Name: Becky Garcia [id number] Date: 08/29/2022 Age:28 hours  Reason for consult: Follow-up assessment;Primapara;1st time breastfeeding;Difficult latch;Term;Breastfeeding assistance;Mother's request  P1, term, 4% weight loss  Mother has been trying to breastfeed and baby is not sustaining latch. Mother's nipples are bruised and tender. Right nipple inverts and left nipple is short shafted. Mother has pre-pumped, hand expressed and made efforts to latch baby with cues and more.   Nipple shield applied to the left breast with formula inserted within the NS. Infant latched well and deep on the breast, coordinated suck and swallow noted. NS was prefilled 4 times and baby was able to latch well each time. Mother was bale to latch baby well. Encouraged to continue to latch baby with NS prefilled with milk to keep baby eager to latch to breast.   Feeding plan established with parents until pediatrician appointment on 7/ 29/2024;  Latch baby with prefilled NS to support baby feeding at breast. Repeat 3-4 times per feeding. Limit to avoid infant fatigue Bottle feed with EBM/ formula 20-30 ml after NS feeding Mother to pump every 3 hours for 15 minutes   Once milk supply is in, baby can feed from nipple shield as long as mother hears swallows and breast softens. Wean off shield as mother and baby tolerate.  OP Lactation Consultant will call to schedule an appointment with mother/baby to assess breastfeeding progress.       Feeding Mother's Current Feeding Choice: Breast Milk and Formula  LATCH Score Latch: Grasps breast easily, tongue down, lips flanged, rhythmical sucking. (with NS)  Audible Swallowing: Spontaneous and intermittent (with milk in NS)  Type of Nipple: Flat  Comfort (Breast/Nipple): Soft / non-tender  Hold (Positioning): Assistance needed to correctly position infant at breast and maintain  latch.  LATCH Score: 8   Lactation Tools Discussed/Used Tools: Comfort gels;Nipple Shields Nipple shield size: 20;24  Interventions Interventions: Breast feeding basics reviewed;Assisted with latch;Support pillows;Adjust position;Comfort gels;Education;LC Services brochure  Discharge Discharge Education: Engorgement and breast care;Warning signs for feeding baby;Outpatient recommendation;Outpatient Epic message sent;Other (comment) (Referral made [facility] at mother's request) Pump: DEBP;Personal (Spectra)  Consult Status Consult Status: Complete Date: 08/29/22    Omar Person 08/29/2022, 4:43 PM

## 2022-09-03 ENCOUNTER — Inpatient Hospital Stay (HOSPITAL_COMMUNITY): Admission: AD | Admit: 2022-09-03 | Payer: BC Managed Care – PPO | Source: Home / Self Care

## 2022-09-03 ENCOUNTER — Encounter: Payer: BC Managed Care – PPO | Admitting: Advanced Practice Midwife

## 2022-09-08 ENCOUNTER — Encounter: Payer: Self-pay | Admitting: *Deleted

## 2022-09-22 ENCOUNTER — Telehealth (HOSPITAL_COMMUNITY): Payer: Self-pay

## 2022-09-22 NOTE — Telephone Encounter (Signed)
09/22/2022 1435  Name: Becky Garcia MRN: [id number] DOB: May 24, 1995  Reason for Call:  Transition of Care Hospital Discharge Call  Contact Status: Patient Contact Status: Message  Language assistant needed: Interpreter Mode: Interpreter Not Needed        Follow-Up Questions:    Inocente Salles Postnatal Depression Scale:  In the Past 7 Days:    PHQ2-9 Depression Scale:     Discharge Follow-up:    Post-discharge interventions: NA  Signature  Signe Colt

## 2022-09-25 ENCOUNTER — Other Ambulatory Visit: Payer: Self-pay | Admitting: Family Medicine

## 2022-10-01 ENCOUNTER — Other Ambulatory Visit: Payer: Self-pay | Admitting: Adult Health

## 2022-10-01 ENCOUNTER — Encounter: Payer: Self-pay | Admitting: Women's Health

## 2022-10-12 ENCOUNTER — Encounter: Payer: Self-pay | Admitting: Women's Health

## 2022-10-12 ENCOUNTER — Other Ambulatory Visit (HOSPITAL_COMMUNITY)
Admission: RE | Admit: 2022-10-12 | Discharge: 2022-10-12 | Disposition: A | Discharge status: Home / Self Care | Payer: BC Managed Care – PPO | Source: Ambulatory Visit | Attending: Women's Health | Admitting: Women's Health

## 2022-10-12 ENCOUNTER — Ambulatory Visit (INDEPENDENT_AMBULATORY_CARE_PROVIDER_SITE_OTHER): Payer: BC Managed Care – PPO | Admitting: Women's Health

## 2022-10-12 DIAGNOSIS — Z124 Encounter for screening for malignant neoplasm of cervix: Secondary | ICD-10-CM | POA: Insufficient documentation

## 2022-10-12 DIAGNOSIS — D649 Anemia, unspecified: Secondary | ICD-10-CM | POA: Diagnosis not present

## 2022-10-12 DIAGNOSIS — Z3009 Encounter for other general counseling and advice on contraception: Secondary | ICD-10-CM

## 2022-10-12 NOTE — Progress Notes (Signed)
POSTPARTUM VISIT Patient name: Becky Garcia MRN [id number]  Date of birth: 22-Feb-1995 Chief Complaint:   Postpartum Care (Pap smear/ last pap 07-25-19 normal)  History of Present Illness:   Becky Garcia is a 27 y.o. G71P1001 Caucasian female being seen today for a postpartum visit. She is 6 weeks postpartum following a spontaneous vaginal delivery at 39.1 gestational weeks. IOL: no, for n/a. Anesthesia: epidural.  Laceration: 2nd degree.  Complications: none. Inpatient contraception: no.   Pregnancy uncomplicated. Tobacco use: no. Substance use disorder: no. Last pap smear: 07/25/19 and results were NILM w/ HRHPV negative. Next pap smear due: now Patient's last menstrual period was 11/27/2021.  Postpartum course has been complicated by mastitis, treated by PCP, no issues now . Bleeding none. Bowel function is normal. Bladder function is normal. Urinary incontinence? no, fecal incontinence? no Patient is not sexually active. Last sexual activity: prior to birth of baby. Desired contraception: POPs. Patient does not know if wants a pregnancy in the future.  Desired family size is 1-2 children.   Upstream - 10/12/22 1037       Pregnancy Intention Screening   Does the patient want to become pregnant in the next year? No    Does the patient's partner want to become pregnant in the next year? No    Would the patient like to discuss contraceptive options today? No      Contraception Wrap Up   Current Method Oral Contraceptive    End Method Oral Contraceptive    Contraception Counseling Provided No            The pregnancy intention screening data noted above was reviewed. Potential methods of contraception were discussed. The patient elected to proceed with Oral Contraceptive.  Edinburgh Postpartum Depression Screening: negative  Edinburgh Postnatal Depression Scale - 10/12/22 1033       Edinburgh Postnatal Depression Scale:  In the Past 7 Days   I have been able to laugh and see  the funny side of things. 0    I have looked forward with enjoyment to things. 0    I have blamed myself unnecessarily when things went wrong. 0    I have been anxious or worried for no good reason. 1    I have felt scared or panicky for no good reason. 0    Things have been getting on top of me. 1    I have been so unhappy that I have had difficulty sleeping. 0    I have felt sad or miserable. 0    I have been so unhappy that I have been crying. 0    The thought of harming myself has occurred to me. 0    Edinburgh Postnatal Depression Scale Total 2                03/02/2022    1:45 PM 08/25/2021    9:59 AM 07/25/2019    2:30 PM  GAD 7 : Generalized Anxiety Score  Nervous, Anxious, on Edge 1 1 0  Control/stop worrying 0 0 0  Worry too much - different things 1 0 0  Trouble relaxing 0 0 0  Restless 0 0 0  Easily annoyed or irritable 0 0 0  Afraid - awful might happen 0 0 0  Total GAD 7 Score 2 1 0  Anxiety Difficulty   Not difficult at all     Baby's course has been uncomplicated. Baby is feeding by breast: milk supply adequate. Infant has  a pediatrician/family doctor? Yes.  Childcare strategy if returning to work/school: family.  Pt has material needs met for her and baby: Yes.   Review of Systems:   Pertinent items are noted in HPI Denies Abnormal vaginal discharge w/ itching/odor/irritation, headaches, visual changes, shortness of breath, chest pain, abdominal pain, severe nausea/vomiting, or problems with urination or bowel movements. Pertinent History Reviewed:  Reviewed past medical,surgical, obstetrical and family history.  Reviewed problem list, medications and allergies. OB History  Gravida Para Term Preterm AB Living  1 1 1     1   SAB IAB Ectopic Multiple Live Births        0 1    # Outcome Date GA Lbr Len/2nd Weight Sex Type Anes PTL Lv  1 Term 08/28/22 [redacted]w[redacted]d 03:46 / 00:47 6 lb 11.6 oz (3.05 kg) M Vag-Spont EPI  LIV   Physical Assessment:   Vitals:    10/12/22 1029  BP: 120/78  Pulse: 74  Weight: 175 lb (79.4 kg)  Height: 5\' 4"  (1.626 m)  Body mass index is 30.04 kg/m.       Physical Examination:   General appearance: alert, well appearing, and in no distress  Mental status: alert, oriented to person, place, and time  Skin: warm & dry   Cardiovascular: normal heart rate noted   Respiratory: normal respiratory effort, no distress   Breasts: deferred, no complaints   Abdomen: soft, non-tender   Pelvic: lac well healed. Thin prep pap obtained: Yes  Rectal: not examined  Extremities: Edema: none   Chaperone: Peggy Dones         No results found for this or any previous visit (from the past 24 hour(s)).  Assessment & Plan:  1) Postpartum exam 2) 6 wks s/p spontaneous vaginal delivery 3) breast feeding 4) Depression screening 5) Contraception> has rx for micronor 6) Anemia> hgb 9.0 at hospital, on Fe, repeat CBC today 7) Cervical cancer screening> pap today  Essential components of care per ACOG recommendations:  1.  Mood and well being:  If positive depression screen, discussed and plan developed.  If using tobacco we discussed reduction/cessation and risk of relapse If current substance abuse, we discussed and referral to local resources was offered.   2. Infant care and feeding:  If breastfeeding, discussed returning to work, pumping, breastfeeding-associated pain, guidance regarding return to fertility while lactating if not using another method. If needed, patient was provided with a letter to be allowed to pump q 2-3hrs to support lactation in a private location with access to a refrigerator to store breastmilk.   Recommended that all caregivers be immunized for flu, pertussis and other preventable communicable diseases If pt does not have material needs met for her/baby, referred to local resources for help obtaining these.  3. Sexuality, contraception and birth spacing Provided guidance regarding sexuality,  management of dyspareunia, and resumption of intercourse Discussed avoiding interpregnancy interval <72mths and recommended birth spacing of 18 months  4. Sleep and fatigue Discussed coping options for fatigue and sleep disruption Encouraged family/partner/community support of 4 hrs of uninterrupted sleep to help with mood and fatigue  5. Physical recovery  If pt had a C/S, assessed incisional pain and providing guidance on normal vs prolonged recovery If pt had a laceration, perineal healing and pain reviewed.  If urinary or fecal incontinence, discussed management and referred to PT or uro/gyn if indicated  Patient is safe to resume physical activity. Discussed attainment of healthy weight.  6.  Chronic disease management  Discussed pregnancy complications if any, and their implications for future childbearing and long-term maternal health. Review recommendations for prevention of recurrent pregnancy complications, such as 17 hydroxyprogesterone caproate to reduce risk for recurrent PTB not applicable, or aspirin to reduce risk of preeclampsia not applicable. Pt had GDM: no. If yes, 2hr GTT scheduled: not applicable. Reviewed medications and non-pregnant dosing including consideration of whether pt is breastfeeding using a reliable resource such as LactMed: not applicable Referred for f/u w/ PCP or subspecialist providers as indicated: not applicable  7. Health maintenance Mammogram at 28yo or earlier if indicated Pap smears as indicated  Meds: No orders of the defined types were placed in this encounter.   Follow-up: Return in about 1 year (around 10/12/2023) for Physical.   Orders Placed This Encounter  Procedures   CBC    Cheral Marker CNM, [facility] 10/12/2022 11:10 AM

## 2022-10-13 LAB — CBC
Hematocrit: 38.3 % (ref 34.0–46.6)
Hemoglobin: 12.7 g/dL (ref 11.1–15.9)
MCH: 28.3 pg (ref 26.6–33.0)
MCHC: 33.2 g/dL (ref 31.5–35.7)
MCV: 86 fL (ref 79–97)
Platelets: 186 10*3/uL (ref 150–450)
RBC: 4.48 x10E6/uL (ref 3.77–5.28)
RDW: 12.5 % (ref 11.7–15.4)
WBC: 5.2 10*3/uL (ref 3.4–10.8)

## 2022-10-14 LAB — CYTOLOGY - PAP
Comment: NEGATIVE
Diagnosis: NEGATIVE
High risk HPV: NEGATIVE

## 2023-07-05 ENCOUNTER — Other Ambulatory Visit: Payer: Self-pay | Admitting: Adult Health

## 2023-08-18 ENCOUNTER — Other Ambulatory Visit: Payer: Self-pay | Admitting: *Deleted

## 2023-08-18 MED ORDER — ESCITALOPRAM OXALATE 10 MG PO TABS: 10.0000 mg | ORAL_TABLET | Freq: Every day | ORAL | 0 refills | Status: DC

## 2023-08-29 ENCOUNTER — Encounter: Payer: Self-pay | Admitting: Women's Health

## 2023-10-27 ENCOUNTER — Encounter: Payer: Self-pay | Admitting: Women's Health

## 2023-10-27 ENCOUNTER — Ambulatory Visit: Admitting: Women's Health

## 2023-10-27 VITALS — BP 120/81 | HR 85 | Ht 64.0 in | Wt 151.0 lb

## 2023-10-27 DIAGNOSIS — Z01419 Encounter for gynecological examination (general) (routine) without abnormal findings: Secondary | ICD-10-CM | POA: Diagnosis not present

## 2023-10-27 DIAGNOSIS — N941 Unspecified dyspareunia: Secondary | ICD-10-CM | POA: Diagnosis not present

## 2023-10-27 DIAGNOSIS — Z30011 Encounter for initial prescription of contraceptive pills: Secondary | ICD-10-CM

## 2023-10-27 MED ORDER — NORETHIN ACE-ETH ESTRAD-FE 1-20 MG-MCG(24) PO TABS: 1.0000 | ORAL_TABLET | Freq: Every day | ORAL | 3 refills | Status: AC

## 2023-10-27 NOTE — Progress Notes (Signed)
 WELL-WOMAN EXAMINATION Patient name: Becky Garcia MRN [id number]  Date of birth: [birth year] Chief Complaint:   Annual Exam  History of Present Illness:   Becky Garcia is a 28 y.o. G59P1001 Caucasian female being seen today for a routine well-woman exam.  Current complaints: no longer breastfeeding, wants to switch from micronor  to COC. Does not smoke, no h/o HTN, DVT/PE, CVA, MI, or migraines w/ aura. Knows insurance won't cover LoLo Pain w/ sex at 7 o'clock at introitus, thinks maybe where stitches were. Bled a little first couple of times she had sex, seems to be improving.   PCP: [facility]      Patient's last menstrual period was 10/15/2023. The current method of family planning is oral progesterone-only contraceptive.  Last pap 10/12/22. Results were: NILM w/ HRHPV negative. H/O abnormal pap: no Last mammogram: never, breast u/s 2021-nl. Results were: N/A. Family h/o breast cancer: possibly MGGM Last colonoscopy: never. Results were: N/A. Family h/o colorectal cancer: yes PGGM     10/27/2023    1:43 PM 03/02/2022    1:45 PM 08/25/2021    9:57 AM 07/25/2019    2:30 PM  Depression screen PHQ 2/9  Decreased Interest 0 0 0 0  Down, Depressed, Hopeless 0 0 0 0  PHQ - 2 Score 0 0 0 0  Altered sleeping 0 0 0 0  Tired, decreased energy 0 1 1 0  Change in appetite 0 0 0 0  Feeling bad or failure about yourself  0 0 0 0  Trouble concentrating 0 0 0 0  Moving slowly or fidgety/restless 0 0 0 0  Suicidal thoughts 0 0 0 0  PHQ-9 Score 0 1 1 0  Difficult doing work/chores    Not difficult at all        10/27/2023    1:43 PM 03/02/2022    1:45 PM 08/25/2021    9:59 AM 07/25/2019    2:30 PM  GAD 7 : Generalized Anxiety Score  Nervous, Anxious, on Edge 1 1 1  0  Control/stop worrying 0 0 0 0  Worry too much - different things 0 1 0 0  Trouble relaxing 0 0 0 0  Restless 0 0 0 0  Easily annoyed or irritable 1 0 0 0  Afraid - awful might happen 0 0 0 0  Total GAD 7 Score 2 2 1  0   Anxiety Difficulty    Not difficult at all     Review of Systems:   Pertinent items are noted in HPI Denies any headaches, blurred vision, fatigue, shortness of breath, chest pain, abdominal pain, abnormal vaginal discharge/itching/odor/irritation, problems with periods, bowel movements, urination, or intercourse unless otherwise stated above. Pertinent History Reviewed:  Reviewed past medical,surgical, social and family history.  Reviewed problem list, medications and allergies. Physical Assessment:   Vitals:   10/27/23 1338  BP: 120/81  Pulse: 85  Weight: 151 lb (68.5 kg)  Height: 5' 4 (1.626 m)  Body mass index is 25.92 kg/m.        Physical Examination:   General appearance - well appearing, and in no distress  Mental status - alert, oriented to person, place, and time  Psych:  She has a normal mood and affect  Skin - warm and dry, normal color, no suspicious lesions noted  Chest - effort normal, all lung fields clear to auscultation bilaterally  Heart - normal rate and regular rhythm  Neck:  midline trachea, no thyromegaly or nodules  Breasts -  breasts appear normal, no suspicious masses, no skin or nipple changes or  axillary nodes  Abdomen - soft, nontender, nondistended, no masses or organomegaly  Pelvic - VULVA: normal appearing vulva with no masses, tenderness or lesions  VAGINA: normal appearing vagina with normal color and discharge, no lesions  CERVIX: normal appearing cervix without discharge or lesions, no CMT  Thin prep pap is not done   UTERUS: uterus is felt to be normal size, shape, consistency and nontender   ADNEXA: No adnexal masses or tenderness noted.  Extremities:  No swelling or varicosities noted  Chaperone: Latisha Cresenzo  No results found for this or any previous visit (from the past 24 hours).  Assessment & Plan:  1) Well-Woman Exam  2) Contraception management> rx Loestrin, condoms x 2wks, f/u  3) Dyspareunia @ 7 o'clock  introitus> improving, no obvious abnormalities on exam. Let me know if not continuing to improve or if worsening  Labs/procedures today: exam  Mammogram: @ 29yo, or sooner if problems Colonoscopy: @ 29yo, or sooner if problems  No orders of the defined types were placed in this encounter.   Meds:  Meds ordered this encounter  Medications   Norethindrone  Acetate-Ethinyl Estrad-FE (LOESTRIN 24 FE) 1-20 MG-MCG(24) tablet    Sig: Take 1 tablet by mouth daily.    Dispense:  90 tablet    Refill:  3    Follow-up: Return in about 3 months (around 01/26/2024) for med f/u, CNM, in person.  Suzen JONELLE Fetters CNM, [facility] 10/27/2023 2:00 PM

## 2023-11-14 ENCOUNTER — Encounter: Payer: Self-pay | Admitting: Women's Health

## 2023-11-14 ENCOUNTER — Other Ambulatory Visit: Payer: Self-pay | Admitting: Women's Health

## 2024-01-25 ENCOUNTER — Ambulatory Visit: Admitting: Women's Health

## 2024-01-25 ENCOUNTER — Encounter: Payer: Self-pay | Admitting: Women's Health

## 2024-01-25 VITALS — BP 124/75 | HR 88 | Ht 64.0 in | Wt 157.0 lb

## 2024-01-25 DIAGNOSIS — Z3041 Encounter for surveillance of contraceptive pills: Secondary | ICD-10-CM | POA: Diagnosis not present

## 2024-01-25 NOTE — Progress Notes (Signed)
" ° °  GYN VISIT Patient name: Becky Garcia MRN [id number]  Date of birth: 06/04/1995 Chief Complaint:   Follow-up (LoEstrin)  History of Present Illness:   Becky Garcia is a 28 y.o. G64P1001 Caucasian female being seen today for f/u on Loestrin rx'd 10/27/23. Doing well, no problems, periods shorter/lighter. Pain at introitus is much better.     Patient's last menstrual period was 01/20/2024. The current method of family planning is OCP (estrogen/progesterone).  Last pap 10/12/22. Results were: NILM w/ HRHPV negative     10/27/2023    1:43 PM 03/02/2022    1:45 PM 08/25/2021    9:57 AM 07/25/2019    2:30 PM  Depression screen PHQ 2/9  Decreased Interest 0 0 0 0  Down, Depressed, Hopeless 0 0 0 0  PHQ - 2 Score 0 0 0 0  Altered sleeping 0 0 0 0  Tired, decreased energy 0 1 1 0  Change in appetite 0 0 0 0  Feeling bad or failure about yourself  0 0 0 0  Trouble concentrating 0 0 0 0  Moving slowly or fidgety/restless 0 0 0 0  Suicidal thoughts 0 0 0 0  PHQ-9 Score 0  1  1  0   Difficult doing work/chores    Not difficult at all     Data saved with a previous flowsheet row definition        10/27/2023    1:43 PM 03/02/2022    1:45 PM 08/25/2021    9:59 AM 07/25/2019    2:30 PM  GAD 7 : Generalized Anxiety Score  Nervous, Anxious, on Edge 1 1 1  0  Control/stop worrying 0 0 0 0  Worry too much - different things 0 1 0 0  Trouble relaxing 0 0 0 0  Restless 0 0 0 0  Easily annoyed or irritable 1 0 0 0  Afraid - awful might happen 0 0 0 0  Total GAD 7 Score 2 2 1  0  Anxiety Difficulty    Not difficult at all     Review of Systems:   Pertinent items are noted in HPI Denies fever/chills, dizziness, headaches, visual disturbances, fatigue, shortness of breath, chest pain, abdominal pain, vomiting, abnormal vaginal discharge/itching/odor/irritation, problems with periods, bowel movements, urination, or intercourse unless otherwise stated above.  Pertinent History Reviewed:  Reviewed  past medical,surgical, social, obstetrical and family history.  Reviewed problem list, medications and allergies. Physical Assessment:   Vitals:   01/25/24 1551  BP: 124/75  Pulse: 88  Weight: 157 lb (71.2 kg)  Height: 5' 4 (1.626 m)  Body mass index is 26.95 kg/m.       Physical Examination:   General appearance: alert, well appearing, and in no distress  Mental status: alert, oriented to person, place, and time  Skin: warm & dry   Cardiovascular: normal heart rate noted  Respiratory: normal respiratory effort, no distress  Abdomen: soft, non-tender   Pelvic: examination not indicated  Extremities: no edema   Chaperone: N/A  No results found for this or any previous visit (from the past 24 hours).  Assessment & Plan:  1) Contraception surveillance> doing well on Loestrin, no problems  Meds: No orders of the defined types were placed in this encounter.   No orders of the defined types were placed in this encounter.   Return for sept for , Physical.  Suzen JONELLE Fetters CNM, [facility] 01/25/2024 4:03 PM  "
